# Patient Record
Sex: Female | Born: 1975 | Race: White | Hispanic: No | Marital: Married | Smoking: Never smoker
Health system: Southern US, Community
[De-identification: ages and names within clinical notes are randomized; demographics above are authoritative.]

## PROBLEM LIST (undated history)

## (undated) DIAGNOSIS — O24419 Gestational diabetes mellitus in pregnancy, unspecified control: Secondary | ICD-10-CM

## (undated) DIAGNOSIS — R011 Cardiac murmur, unspecified: Secondary | ICD-10-CM

## (undated) DIAGNOSIS — G4730 Sleep apnea, unspecified: Secondary | ICD-10-CM

## (undated) HISTORY — PX: WISDOM TOOTH EXTRACTION: SHX21

## (undated) HISTORY — PX: MYRINGOTOMY: SUR874

## (undated) HISTORY — PX: HERNIA REPAIR: SHX51

---

## 1998-05-27 ENCOUNTER — Other Ambulatory Visit: Admission: RE | Admit: 1998-05-27 | Discharge: 1998-05-27 | Payer: Self-pay | Admitting: *Deleted

## 1999-04-16 ENCOUNTER — Other Ambulatory Visit: Admission: RE | Admit: 1999-04-16 | Discharge: 1999-04-16 | Payer: Self-pay | Admitting: *Deleted

## 2000-04-12 ENCOUNTER — Other Ambulatory Visit: Admission: RE | Admit: 2000-04-12 | Discharge: 2000-04-12 | Payer: Self-pay | Admitting: *Deleted

## 2001-06-19 ENCOUNTER — Other Ambulatory Visit: Admission: RE | Admit: 2001-06-19 | Discharge: 2001-06-19 | Payer: Self-pay | Admitting: *Deleted

## 2002-01-16 ENCOUNTER — Other Ambulatory Visit: Admission: RE | Admit: 2002-01-16 | Discharge: 2002-01-16 | Payer: Self-pay | Admitting: Obstetrics and Gynecology

## 2002-06-24 ENCOUNTER — Encounter: Admission: RE | Admit: 2002-06-24 | Discharge: 2002-06-24 | Payer: Self-pay | Admitting: Obstetrics and Gynecology

## 2002-08-20 ENCOUNTER — Inpatient Hospital Stay (HOSPITAL_COMMUNITY): Admission: AD | Admit: 2002-08-20 | Discharge: 2002-08-22 | Payer: Self-pay | Admitting: Obstetrics and Gynecology

## 2002-09-24 ENCOUNTER — Other Ambulatory Visit: Admission: RE | Admit: 2002-09-24 | Discharge: 2002-09-24 | Payer: Self-pay | Admitting: Obstetrics and Gynecology

## 2003-10-27 ENCOUNTER — Other Ambulatory Visit: Admission: RE | Admit: 2003-10-27 | Discharge: 2003-10-27 | Payer: Self-pay | Admitting: Obstetrics and Gynecology

## 2004-11-25 ENCOUNTER — Inpatient Hospital Stay (HOSPITAL_COMMUNITY): Admission: AD | Admit: 2004-11-25 | Discharge: 2004-11-27 | Payer: Self-pay | Admitting: Obstetrics and Gynecology

## 2008-04-27 ENCOUNTER — Emergency Department (HOSPITAL_COMMUNITY): Admission: EM | Admit: 2008-04-27 | Discharge: 2008-04-27 | Payer: Self-pay | Admitting: Emergency Medicine

## 2010-11-19 NOTE — H&P (Signed)
NAME:  Kelly Osborne, [name] NO.:  [id number]   MEDICAL RECORD NO.:  [id number]                   PATIENT TYPE:   LOCATION:                                       FACILITY:   PHYSICIAN:  Maxie Better, M.D.            DATE OF BIRTH:  1976/05/03   DATE OF ADMISSION:  08/20/2002  DATE OF DISCHARGE:                                HISTORY & PHYSICAL   CHIEF COMPLAINT:  Induction of labor secondary to gestational diabetes.   HISTORY OF PRESENT ILLNESS:  This is a 35 year old, gravida 1, para 0,  married white female, last menstrual period 11/14/2001, [facility] 08/22/2002, who is  now at term and being admitted for induction of labor secondary to Class A1  gestational diabetes.  The patient has had gestational diabetes well  controlled with diet until the past week at which time she noted that her  blood sugars at dinnertime have been elevated as high as 172.  Ultrasound  done on 08/19/2002 showed estimated fetal weight of 7 pound 7 ounces which  was at the 36th percentile, amniotic fluid index 80th percentile.  She has  had irregular contractions, good fetal movement.  Her NSTs have been  reactive.  Last exam she was 80%, -2, vertex presentation.  Group B strep  culture is negative.  Prenatal care was at [facility], Maxie Better,  M.D., primary obstetrician.   PRENATAL LABORATORY DATA:  Blood type O negative.  The father of the baby  was O positive.  The patient received RhoGAM at 28 weeks.  RPR is  nonreactive, rubella immune, hepatitis B surface antigen negative, HIV  negative, GC and Chlamydia cultures negative.  Pap was within normal limits.  One-hour GTT was 183 on 12/5, three-hour GTT was abnormal.  Normal anatomic  fetal survey 04/10/2002 at 20-4/7 weeks.  Group B strep culture was negative.   ALLERGIES:  SULFA.   MEDICATIONS:  Prenatal vitamins.   PAST MEDICAL HISTORY:  Heart murmur without need for antibiotic prophylaxis.   PAST SURGICAL  HISTORY:  Hernia repair in infancy.   FAMILY HISTORY:  Noncontributory.   SOCIAL HISTORY:  Married, nonsmoker, Airline pilot.   REVIEW OF SYSTEMS:  Negative except as noted in History of Present Illness.   PHYSICAL EXAMINATION:  GENERAL:  Well developed, well nourished, gravid  white female in no acute distress.  VITAL SIGNS:  Blood pressure 104/70, weight 147-1/2 pounds.  Fetal heart  rate 130.  SKIN:  No lesions.  HEENT;  Anicteric sclerae.  Pink conjunctivae.  Oropharynx negative.  HEART:  Regular rate and rhythm without murmur.  LUNGS:  Clear to auscultation.  BREASTS:  Soft, nontender.  No palpable mass.  ABDOMEN:  Gravid.  Fundal height 39 cm.  PELVIC:  See HPI.  EXTREMITIES:  No edema.   IMPRESSION:  1. Class A1 gestational diabetes.  2. Term gestation.   PLAN:  1. Admission  2. Routine admission labs.  3. Pitocin induction.  4. Analgesics p.r.n.  5. Blood sugar checks as per protocol.                                               Maxie Better, M.D.    [place]/MEDQ  D:  08/19/2002  T:  08/19/2002  Job:  [id number]

## 2016-07-18 ENCOUNTER — Other Ambulatory Visit: Payer: Self-pay | Admitting: Obstetrics and Gynecology

## 2016-07-20 ENCOUNTER — Encounter (HOSPITAL_COMMUNITY): Payer: Self-pay

## 2016-07-21 ENCOUNTER — Encounter (HOSPITAL_COMMUNITY)
Admission: RE | Disposition: A | Discharge status: Home / Self Care | Payer: Self-pay | Source: Ambulatory Visit | Attending: Obstetrics and Gynecology

## 2016-07-21 ENCOUNTER — Encounter (HOSPITAL_COMMUNITY): Payer: Self-pay

## 2016-07-21 ENCOUNTER — Ambulatory Visit (HOSPITAL_COMMUNITY): Payer: Managed Care, Other (non HMO) | Admitting: Anesthesiology

## 2016-07-21 ENCOUNTER — Ambulatory Visit (HOSPITAL_COMMUNITY)
Admission: RE | Admit: 2016-07-21 | Discharge: 2016-07-21 | Disposition: A | Discharge status: Home / Self Care | Payer: Managed Care, Other (non HMO) | Source: Ambulatory Visit | Attending: Obstetrics and Gynecology | Admitting: Obstetrics and Gynecology

## 2016-07-21 DIAGNOSIS — N920 Excessive and frequent menstruation with regular cycle: Secondary | ICD-10-CM | POA: Diagnosis present

## 2016-07-21 DIAGNOSIS — R011 Cardiac murmur, unspecified: Secondary | ICD-10-CM | POA: Diagnosis not present

## 2016-07-21 DIAGNOSIS — D250 Submucous leiomyoma of uterus: Secondary | ICD-10-CM | POA: Insufficient documentation

## 2016-07-21 HISTORY — DX: Gestational diabetes mellitus in pregnancy, unspecified control: O24.419

## 2016-07-21 HISTORY — DX: Cardiac murmur, unspecified: R01.1

## 2016-07-21 HISTORY — PX: HYSTEROSCOPY: SHX211

## 2016-07-21 HISTORY — PX: DILATATION & CURETTAGE/HYSTEROSCOPY WITH MYOSURE: SHX6511

## 2016-07-21 LAB — CBC
HCT: 35.7 % — ABNORMAL LOW (ref 36.0–46.0)
HEMOGLOBIN: 12 g/dL (ref 12.0–15.0)
MCH: 30.2 pg (ref 26.0–34.0)
MCHC: 33.6 g/dL (ref 30.0–36.0)
MCV: 89.7 fL (ref 78.0–100.0)
Platelets: 349 10*3/uL (ref 150–400)
RBC: 3.98 MIL/uL (ref 3.87–5.11)
RDW: 13.2 % (ref 11.5–15.5)
WBC: 7.5 10*3/uL (ref 4.0–10.5)

## 2016-07-21 SURGERY — DILATATION & CURETTAGE/HYSTEROSCOPY WITH MYOSURE
Anesthesia: General | Site: Vagina

## 2016-07-21 MED ORDER — FENTANYL CITRATE (PF) 250 MCG/5ML IJ SOLN
INTRAMUSCULAR | Status: DC | PRN
  Administered 2016-07-21: 100 ug via INTRAVENOUS
  Administered 2016-07-21: 50 ug via INTRAVENOUS
  Administered 2016-07-21 (×2): 25 ug via INTRAVENOUS

## 2016-07-21 MED ORDER — FENTANYL CITRATE (PF) 100 MCG/2ML IJ SOLN: 25.0000 ug | INTRAMUSCULAR | Status: DC | PRN

## 2016-07-21 MED ORDER — SODIUM CHLORIDE 0.9 % IR SOLN
Status: DC | PRN
  Administered 2016-07-21 (×2): 3000 mL

## 2016-07-21 MED ORDER — ONDANSETRON HCL 4 MG/2ML IJ SOLN
INTRAMUSCULAR | Status: DC | PRN
  Administered 2016-07-21: 4 mg via INTRAVENOUS

## 2016-07-21 MED ORDER — PROPOFOL 10 MG/ML IV BOLUS
INTRAVENOUS | Status: DC | PRN
  Administered 2016-07-21: 200 mg via INTRAVENOUS

## 2016-07-21 MED ORDER — METOCLOPRAMIDE HCL 5 MG/ML IJ SOLN
INTRAMUSCULAR | Status: DC | PRN
  Administered 2016-07-21: 10 mg via INTRAVENOUS

## 2016-07-21 MED ORDER — MIDAZOLAM HCL 2 MG/2ML IJ SOLN
INTRAMUSCULAR | Status: DC | PRN
  Administered 2016-07-21: 1 mg via INTRAVENOUS

## 2016-07-21 MED ORDER — GLYCOPYRROLATE 0.2 MG/ML IJ SOLN
INTRAMUSCULAR | Status: DC | PRN
  Administered 2016-07-21: 0.1 mg via INTRAVENOUS

## 2016-07-21 MED ORDER — LACTATED RINGERS IV SOLN
INTRAVENOUS | Status: DC
  Administered 2016-07-21: 125 mL/h via INTRAVENOUS
  Administered 2016-07-21: 08:00:00 via INTRAVENOUS

## 2016-07-21 MED ORDER — METOCLOPRAMIDE HCL 5 MG/ML IJ SOLN: 10.0000 mg | Freq: Once | INTRAMUSCULAR | Status: DC | PRN

## 2016-07-21 MED ORDER — LACTATED RINGERS IV SOLN: INTRAVENOUS | Status: DC

## 2016-07-21 MED ORDER — DEXAMETHASONE SODIUM PHOSPHATE 4 MG/ML IJ SOLN
INTRAMUSCULAR | Status: DC | PRN
  Administered 2016-07-21: 4 mg via INTRAVENOUS

## 2016-07-21 MED ORDER — MIDAZOLAM HCL 2 MG/2ML IJ SOLN
INTRAMUSCULAR | Status: AC
  Filled 2016-07-21: qty 2

## 2016-07-21 MED ORDER — SCOPOLAMINE 1 MG/3DAYS TD PT72
MEDICATED_PATCH | TRANSDERMAL | Status: DC
Start: 2016-07-21 — End: 2016-07-21
  Administered 2016-07-21: 1.5 mg via TRANSDERMAL
  Filled 2016-07-21: qty 1

## 2016-07-21 MED ORDER — METOCLOPRAMIDE HCL 5 MG/ML IJ SOLN
INTRAMUSCULAR | Status: AC
  Filled 2016-07-21: qty 2

## 2016-07-21 MED ORDER — CHLOROPROCAINE HCL 1 % IJ SOLN
INTRAMUSCULAR | Status: AC
  Filled 2016-07-21: qty 30

## 2016-07-21 MED ORDER — LIDOCAINE HCL (CARDIAC) 20 MG/ML IV SOLN
INTRAVENOUS | Status: AC
  Filled 2016-07-21: qty 5

## 2016-07-21 MED ORDER — FENTANYL CITRATE (PF) 100 MCG/2ML IJ SOLN
INTRAMUSCULAR | Status: AC
  Filled 2016-07-21: qty 4

## 2016-07-21 MED ORDER — KETOROLAC TROMETHAMINE 30 MG/ML IJ SOLN
INTRAMUSCULAR | Status: DC | PRN
  Administered 2016-07-21: 30 mg via INTRAVENOUS
  Administered 2016-07-21: 30 mg via INTRAMUSCULAR

## 2016-07-21 MED ORDER — SCOPOLAMINE 1 MG/3DAYS TD PT72
1.0000 | MEDICATED_PATCH | Freq: Once | TRANSDERMAL | Status: DC
  Administered 2016-07-21: 1.5 mg via TRANSDERMAL

## 2016-07-21 MED ORDER — IBUPROFEN 800 MG PO TABS: 800.0000 mg | ORAL_TABLET | Freq: Two times a day (BID) | ORAL | 1 refills | Status: DC | PRN

## 2016-07-21 MED ORDER — LIDOCAINE HCL (CARDIAC) 20 MG/ML IV SOLN
INTRAVENOUS | Status: DC | PRN
  Administered 2016-07-21: 100 mg via INTRAVENOUS

## 2016-07-21 MED ORDER — ONDANSETRON HCL 4 MG/2ML IJ SOLN
INTRAMUSCULAR | Status: AC
  Filled 2016-07-21: qty 2

## 2016-07-21 MED ORDER — DEXAMETHASONE SODIUM PHOSPHATE 10 MG/ML IJ SOLN
INTRAMUSCULAR | Status: AC
  Filled 2016-07-21: qty 1

## 2016-07-21 MED ORDER — OXYCODONE-ACETAMINOPHEN 5-325 MG PO TABS: 1.0000 | ORAL_TABLET | ORAL | 0 refills | Status: DC | PRN

## 2016-07-21 MED ORDER — MEPERIDINE HCL 25 MG/ML IJ SOLN: 6.2500 mg | INTRAMUSCULAR | Status: DC | PRN

## 2016-07-21 MED ORDER — PROPOFOL 10 MG/ML IV BOLUS
INTRAVENOUS | Status: AC
  Filled 2016-07-21: qty 20

## 2016-07-21 MED ORDER — CHLOROPROCAINE HCL 1 % IJ SOLN
INTRAMUSCULAR | Status: DC | PRN
  Administered 2016-07-21: 20 mL

## 2016-07-21 SURGICAL SUPPLY — 22 items
ABLATOR ENDOMETRIAL BIPOLAR (ABLATOR) ×3 IMPLANT
CANISTER SUCT 3000ML (MISCELLANEOUS) ×3 IMPLANT
CATH ROBINSON RED A/P 16FR (CATHETERS) ×3 IMPLANT
CLOTH BEACON ORANGE TIMEOUT ST (SAFETY) ×3 IMPLANT
CONTAINER PREFILL 10% NBF 60ML (FORM) ×3 IMPLANT
DEVICE MYOSURE LITE (MISCELLANEOUS) IMPLANT
DEVICE MYOSURE REACH (MISCELLANEOUS) ×3 IMPLANT
ELECT REM PT RETURN 9FT ADLT (ELECTROSURGICAL)
ELECTRODE REM PT RTRN 9FT ADLT (ELECTROSURGICAL) IMPLANT
FILTER ARTHROSCOPY CONVERTOR (FILTER) ×3 IMPLANT
GLOVE BIOGEL PI IND STRL 7.0 (GLOVE) ×2 IMPLANT
GLOVE BIOGEL PI INDICATOR 7.0 (GLOVE) ×4
GLOVE ECLIPSE 6.5 STRL STRAW (GLOVE) ×3 IMPLANT
GOWN STRL REUS W/TWL LRG LVL3 (GOWN DISPOSABLE) ×9 IMPLANT
PACK VAGINAL MINOR WOMEN LF (CUSTOM PROCEDURE TRAY) ×3 IMPLANT
PAD OB MATERNITY 4.3X12.25 (PERSONAL CARE ITEMS) ×6 IMPLANT
SEAL ROD LENS SCOPE MYOSURE (ABLATOR) ×3 IMPLANT
SET GENESYS HTA PROCERVA (MISCELLANEOUS) ×3 IMPLANT
TOWEL OR 17X24 6PK STRL BLUE (TOWEL DISPOSABLE) ×6 IMPLANT
TUBING AQUILEX INFLOW (TUBING) ×3 IMPLANT
TUBING AQUILEX OUTFLOW (TUBING) ×3 IMPLANT
WATER STERILE IRR 1000ML POUR (IV SOLUTION) ×3 IMPLANT

## 2016-07-21 NOTE — Brief Op Note (Signed)
07/21/2016  8:28 AM  PATIENT:  Kelly Osborne  41 y.o. female  PRE-OPERATIVE DIAGNOSIS:  Submucosal Fibroid. menorrhagia  POST-OPERATIVE DIAGNOSIS:  Submucosal Fibroid, Menorrhagia  PROCEDURE:  Diagnostic hysteroscopy, Dilation and curettage, hysteroscopic resection of submucosal fibroid  SURGEON:  Surgeon(s) and Role:    * Servando Salina, MD - Primary  PHYSICIAN ASSISTANT:   ASSISTANTS: none   ANESTHESIA:   general and paracervical block FINDINGS; posterior central SM fibroid, left >right tubal ostia seen EBL:  Total I/O In: 1300 [I.V.:1300] Out: 15 [Blood:15]  BLOOD ADMINISTERED:none  DRAINS: none   LOCAL MEDICATIONS USED:  OTHER nesicaine  SPECIMEN:  Source of Specimen:  SM fibroid resection, emc  DISPOSITION OF SPECIMEN:  PATHOLOGY  COUNTS:  YES  TOURNIQUET:  * No tourniquets in log *  DICTATION: .Other Dictation: Dictation Number [id number]  PLAN OF CARE: Discharge to home after PACU  PATIENT DISPOSITION:  PACU - hemodynamically stable.   Delay start of Pharmacological VTE agent (>24hrs) due to surgical blood loss or risk of bleeding: no

## 2016-07-21 NOTE — Transfer of Care (Signed)
Immediate Anesthesia Transfer of Care Note  Patient: Kelly Osborne  Procedure(s) Performed: Procedure(s): DILATATION & CURETTAGE/HYSTEROSCOPY WITH MYOSURE (N/A) HYSTEROSCOPY WITH HYDROTHERMAL ABLATION (N/A)  Patient Location: PACU  Anesthesia Type:General  Level of Consciousness: awake, alert  and oriented  Airway & Oxygen Therapy: Patient Spontanous Breathing and Patient connected to nasal cannula oxygen  Post-op Assessment: Report given to RN and Post -op Vital signs reviewed and stable  Post vital signs: Reviewed and stable  Last Vitals:  Vitals:   07/21/16 0610  BP: 111/60  Pulse: 81  Resp: 16  Temp: 37 C    Last Pain:  Vitals:   07/21/16 0610  TempSrc: Oral      Patients Stated Pain Goal: 4 ([id number])  Complications: No apparent anesthesia complications

## 2016-07-21 NOTE — Op Note (Signed)
NAMEAM…LIE, DEAN                 ACCOUNT NO.:  [id number]  MEDICAL RECORD NO.:  [id number]  LOCATION:  [facility]                          FACILITY:  [facility]  PHYSICIAN:  Servando Salina, M.D.DATE OF BIRTH:  1975/07/16  DATE OF PROCEDURE:  07/21/2016 DATE OF DISCHARGE:                              OPERATIVE REPORT   PREOPERATIVE DIAGNOSIS:  Menorrhagia, submucosal fibroid.  PROCEDURE:  Diagnostic hysteroscopy, hysteroscopic resection of submucosal fibroid using MyoSure, NovaSure endometrial ablation.  POSTOPERATIVE DIAGNOSIS:  Menorrhagia, submucosal fibroid.  ANESTHESIA:  General, paracervical block.  SURGEON:  Servando Salina, M.D..  ASSISTANT:  None.  DESCRIPTION OF PROCEDURE:  Under general anesthesia, the patient was placed in dorsal lithotomy position.  She was sterilely prepped and draped in usual fashion.  The patient had voided prior to entering the room and therefore it was not catheterized.  Examination under anesthesia revealed a retroverted uterus.  No adnexal masses could be appreciated.  Bivalve speculum was placed in vagina.  Single-tooth tenaculum was placed on anterior lip of the cervix.  A 20 mL of 1% Nesacaine was injected paracervically at the 3 and 9 o'clock position. The cervix was then serially dilated up to #23 [facility] dilator.  A hysteroscope was introduced into the uterine cavity.  The posterior submucosal fibroid was noted centrally.  The left greater than the right tubal ostia was seen.  Using the Reach MyoSure, the fibroid was resected.  Subsequently the cavity was gently curetted.  The instrument was then removed.  The uterus sounded to 10 cm.  The endocervical canal was 3 cm and using a NovaSure endometrial apparatus which was then inserted into the cavity with a 4.3 was noted and the cavity length 6.5 cm was then ablated for 35 seconds.  The instrument was removed.  The hysteroscope was then reinserted.  The majority of the cavity had been ablated.   At that point, all instruments were then removed from the vagina.  SPECIMEN:  Endometrial curetting with fibroid resection sent to Pathology.  ESTIMATED BLOOD LOSS:  15 mL.  COMPLICATION:  None.  FLUID DEFICIT:  1200 mL.  The patient tolerated the procedure well, was transferred to recovery room in stable condition.     Servando Salina, M.D.     [place]/MEDQ  D:  07/21/2016  T:  07/21/2016  Job:  [id number]

## 2016-07-21 NOTE — Discharge Instructions (Signed)
DISCHARGE INSTRUCTIONS: HYSTEROSCOPY / ENDOMETRIAL ABLATION °The following instructions have been prepared to help you care for yourself upon your return home. ° °May Remove Scop patch on or before ° °May take Ibuprofen after ° °May take stool softner while taking narcotic pain medication to prevent constipation.  Drink plenty of water. ° °Personal hygiene: °• Use sanitary pads for vaginal drainage, not tampons. °• Shower the day after your procedure. °• NO tub baths, pools or Jacuzzis for 2-3 weeks. °• Wipe front to back after using the bathroom. ° °Activity and limitations: °• Do NOT drive or operate any equipment for 24 hours. The effects of anesthesia are still present °and drowsiness may result. °• Do NOT rest in bed all day. °• Walking is encouraged. °• Walk up and down stairs slowly. °• You may resume your normal activity in one to two days or as indicated by your physician. °Sexual activity: NO intercourse for at least 2 weeks after the procedure, or as indicated by your °Doctor. ° °Diet: Eat a light meal as desired this evening. You may resume your usual diet tomorrow. ° °Return to Work: You may resume your work activities in one to two days or as indicated by your °Doctor. ° °What to expect after your surgery: Expect to have vaginal bleeding/discharge for 2-3 days and °spotting for up to 10 days. It is not unusual to have soreness for up to 1-2 weeks. You may have a °slight burning sensation when you urinate for the first day. Mild cramps may continue for a couple of °days. You may have a regular period in 2-6 weeks. ° °Call your doctor for any of the following: °• Excessive vaginal bleeding or clotting, saturating and changing one pad every hour. °• Inability to urinate 6 hours after discharge from hospital. °• Pain not relieved by pain medication. °• Fever of 100.4° F or greater. °• Unusual vaginal discharge or odor. ° °Return to office _________________Call for an appointment  ___________________ °Patient’s signature: ______________________ °Nurse’s signature ________________________ ° °Post Anesthesia Care Unit [phone] °Post Anesthesia Home Care Instructions ° °Activity: °Get plenty of rest for the remainder of the day. A responsible adult should stay with you for 24 hours following the procedure.  °For the next 24 hours, DO NOT: °-Drive a car °-Operate machinery °-Drink alcoholic beverages °-Take any medication unless instructed by your physician °-Make any legal decisions or sign important papers. ° °Meals: °Start with liquid foods such as gelatin or soup. Progress to regular foods as tolerated. Avoid greasy, spicy, heavy foods. If nausea and/or vomiting occur, drink only clear liquids until the nausea and/or vomiting subsides. Call your physician if vomiting continues. ° °Special Instructions/Symptoms: °Your throat may feel dry or sore from the anesthesia or the breathing tube placed in your throat during surgery. If this causes discomfort, gargle with warm salt water. The discomfort should disappear within 24 hours. ° °If you had a scopolamine patch placed behind your ear for the management of post- operative nausea and/or vomiting: ° °1. The medication in the patch is effective for 72 hours, after which it should be removed.  Wrap patch in a tissue and discard in the trash. Wash hands thoroughly with soap and water. °2. You may remove the patch earlier than 72 hours if you experience unpleasant side effects which may include dry mouth, dizziness or visual disturbances. °3. Avoid touching the patch. Wash your hands with soap and water after contact with the patch. °  °CALL  IF TEMP>100.4, NOTHING PER   VAGINA X 2 WK, CALL IF SOAKING A MAXI  PAD EVERY HOUR OR MORE FREQUENTLY °

## 2016-07-21 NOTE — Anesthesia Postprocedure Evaluation (Signed)
Anesthesia Post Note  Patient: TEATHER JASSO  Procedure(s) Performed: Procedure(s) (LRB): DILATATION & CURETTAGE/HYSTEROSCOPY WITH MYOSURE (N/A) HYSTEROSCOPY WITH HYDROTHERMAL ABLATION (N/A)  Patient location during evaluation: PACU Anesthesia Type: General Level of consciousness: awake and alert Pain management: pain level controlled Vital Signs Assessment: post-procedure vital signs reviewed and stable Respiratory status: spontaneous breathing, nonlabored ventilation, respiratory function stable and patient connected to nasal cannula oxygen Cardiovascular status: blood pressure returned to baseline and stable Postop Assessment: no signs of nausea or vomiting Anesthetic complications: no        Last Vitals:  Vitals:   07/21/16 0915 07/21/16 1002  BP: 110/60 125/73  Pulse: 96 85  Resp: 19 16  Temp: 37 C 36.7 C    Last Pain:  Vitals:   07/21/16 1002  TempSrc:   PainSc: 2    Pain Goal: Patients Stated Pain Goal: 4 (07/21/16 1002)               Montez Hageman

## 2016-07-21 NOTE — Anesthesia Procedure Notes (Signed)
Procedure Name: LMA Insertion Date/Time: 07/21/2016 7:35 AM Performed by: Flossie Dibble Pre-anesthesia Checklist: Patient identified, Patient being monitored, Emergency Drugs available, Timeout performed and Suction available Patient Re-evaluated:Patient Re-evaluated prior to inductionOxygen Delivery Method: Circle System Utilized Preoxygenation: Pre-oxygenation with 100% oxygen Intubation Type: IV induction LMA: LMA inserted LMA Size: 4.0 Number of attempts: 1 Placement Confirmation: positive ETCO2 and breath sounds checked- equal and bilateral Tube secured with: Tape Dental Injury: Teeth and Oropharynx as per pre-operative assessment

## 2016-07-21 NOTE — H&P (Signed)
Kelly Osborne is an 41 y.o. female. G2P2 MWF presents for dx hysteroscopy, resection of SM fibroid, D&C, endometrial ablation due to menorrhagia and fundal fibroid noted on sonohysterogram. Pt has had heavy vaginal bleeding despite OCP Endometrial biopsy done 1/16 is still pending Pertinent Gynecological History: Menses: flow is excessive with use of 6 pads or tampons on heaviest days Bleeding: dysfunctional uterine bleeding Contraception: OCP (estrogen/progesterone) DES exposure: denies Blood transfusions: none Sexually transmitted diseases: no past history Previous GYN Procedures: none  Last mammogram: n/a Date: n/a Last pap: normal Date: 2017 OB History: G2, P2   Menstrual History: Menarche age: n/a Patient's last menstrual period was 07/09/2016 (exact date).    Past Medical History:  Diagnosis Date  . Gestational diabetes   . Heart murmur    mild, noted with pregnancy    Past Surgical History:  Procedure Laterality Date  . HERNIA REPAIR     childhood  . MYRINGOTOMY    . WISDOM TOOTH EXTRACTION      History reviewed. No pertinent family history.  Social History:  reports that she has never smoked. She has never used smokeless tobacco. She reports that she drinks alcohol. She reports that she does not use drugs.  Allergies:  Allergies  Allergen Reactions  . Sulfa Antibiotics Rash    No prescriptions prior to admission.    Review of Systems  All other systems reviewed and are negative.   Last menstrual period 07/09/2016. Physical Exam  Constitutional: She is oriented to person, place, and time. She appears well-developed and well-nourished.  HENT:  Head: Atraumatic.  Eyes: EOM are normal. Pupils are equal, round, and reactive to light.  Neck: Neck supple.  Cardiovascular: Regular rhythm.   Respiratory: Breath sounds normal.  GI: Soft. Bowel sounds are normal.  Genitourinary: Vagina normal.  Genitourinary Comments: Sl enlarged uterus Adnexa nl cervix  parous  Musculoskeletal: Normal range of motion.  Neurological: She is alert and oriented to person, place, and time.  Skin: Skin is warm and dry.  Psychiatric: She has a normal mood and affect.    No results found for this or any previous visit (from the past 24 hour(s)).  No results found.  Assessment/Plan: SM fibroid Menorrhagia with regular cycle P) dx hysteroscopy, D&C, resection of  SM fibroid. Risk of surgery includes infection, bleeding, thermal injury, uterine perforation and its risk, inability to resection fibroid, fluid overload and its mgmt. All ? answered  Neilah Fulwider A 07/21/2016, 12:34 AM

## 2016-07-21 NOTE — Anesthesia Preprocedure Evaluation (Signed)
Anesthesia Evaluation  Patient identified by MRN, date of birth, ID band Patient awake    Reviewed: Allergy & Precautions, NPO status , Patient's Chart, lab work & pertinent test results  Airway Mallampati: II  TM Distance: >3 FB Neck ROM: Full    Dental no notable dental hx.    Pulmonary neg pulmonary ROS,    Pulmonary exam normal breath sounds clear to auscultation       Cardiovascular negative cardio ROS Normal cardiovascular exam Rhythm:Regular Rate:Normal     Neuro/Psych negative neurological ROS  negative psych ROS   GI/Hepatic negative GI ROS, Neg liver ROS,   Endo/Other  negative endocrine ROSneg diabetes  Renal/GU negative Renal ROS  negative genitourinary   Musculoskeletal negative musculoskeletal ROS (+)   Abdominal   Peds negative pediatric ROS (+)  Hematology negative hematology ROS (+)   Anesthesia Other Findings   Reproductive/Obstetrics negative OB ROS                             Anesthesia Physical Anesthesia Plan  ASA: II  Anesthesia Plan: General   Post-op Pain Management:    Induction: Intravenous  Airway Management Planned: LMA  Additional Equipment:   Intra-op Plan:   Post-operative Plan: Extubation in OR  Informed Consent: I have reviewed the patients History and Physical, chart, labs and discussed the procedure including the risks, benefits and alternatives for the proposed anesthesia with the patient or authorized representative who has indicated his/her understanding and acceptance.   Dental advisory given  Plan Discussed with: CRNA  Anesthesia Plan Comments:         Anesthesia Quick Evaluation

## 2016-07-22 ENCOUNTER — Encounter (HOSPITAL_COMMUNITY): Payer: Self-pay | Admitting: Obstetrics and Gynecology

## 2017-01-05 ENCOUNTER — Other Ambulatory Visit: Payer: Self-pay | Admitting: Obstetrics and Gynecology

## 2017-01-06 NOTE — Patient Instructions (Signed)
Your procedure is scheduled on:  Thursday, January 12, 2017  Enter through the [employer] [facility] at:  10:45 AM  Pick up the phone at the desk and dial [phone].  Call this number if you have problems the morning of surgery: [phone].  Remember: Do NOT eat food:  After Midnight Wednesday  Do NOT drink clear liquids after:  6:00 AM Thursday  Take these medicines the morning of surgery with a SIP OF WATER:  None  Stop ALL herbal medications at this time  Do NOT smoke the day of surgery.  Do NOT wear jewelry (body piercing), metal hair clips/bobby pins, make-up, artifical eyelashes or nail polish. Do NOT wear lotions, powders, or perfumes.  You may wear deodorant. Do NOT shave for 48 hours prior to surgery. Do NOT bring valuables to the hospital. Contacts, dentures, or bridgework may not be worn into surgery.  Leave suitcase in car.  After surgery it may be brought to your room.  For patients admitted to the hospital, checkout time is 11:00 AM the day of discharge.  Bring a copy of your healthcare power of attorney and living will documents.

## 2017-01-09 ENCOUNTER — Encounter (HOSPITAL_COMMUNITY)
Admission: RE | Admit: 2017-01-09 | Discharge: 2017-01-09 | Disposition: A | Discharge status: Home / Self Care | Payer: Managed Care, Other (non HMO) | Source: Ambulatory Visit | Attending: Obstetrics and Gynecology | Admitting: Obstetrics and Gynecology

## 2017-01-09 ENCOUNTER — Encounter (HOSPITAL_COMMUNITY): Payer: Self-pay

## 2017-01-09 DIAGNOSIS — N938 Other specified abnormal uterine and vaginal bleeding: Secondary | ICD-10-CM | POA: Insufficient documentation

## 2017-01-09 DIAGNOSIS — D259 Leiomyoma of uterus, unspecified: Secondary | ICD-10-CM | POA: Insufficient documentation

## 2017-01-09 DIAGNOSIS — Z01812 Encounter for preprocedural laboratory examination: Secondary | ICD-10-CM | POA: Diagnosis present

## 2017-01-09 LAB — CBC
HEMATOCRIT: 35.4 % — AB (ref 36.0–46.0)
HEMOGLOBIN: 11.4 g/dL — AB (ref 12.0–15.0)
MCH: 28.1 pg (ref 26.0–34.0)
MCHC: 32.2 g/dL (ref 30.0–36.0)
MCV: 87.4 fL (ref 78.0–100.0)
Platelets: 380 10*3/uL (ref 150–400)
RBC: 4.05 MIL/uL (ref 3.87–5.11)
RDW: 13.5 % (ref 11.5–15.5)
WBC: 9.9 10*3/uL (ref 4.0–10.5)

## 2017-01-09 LAB — BASIC METABOLIC PANEL
ANION GAP: 6 (ref 5–15)
BUN: 8 mg/dL (ref 6–20)
CO2: 26 mmol/L (ref 22–32)
Calcium: 8.8 mg/dL — ABNORMAL LOW (ref 8.9–10.3)
Chloride: 105 mmol/L (ref 101–111)
Creatinine, Ser: 0.88 mg/dL (ref 0.44–1.00)
GFR calc Af Amer: >60 mL/min (ref >60)
GLUCOSE: 169 mg/dL — AB (ref 65–99)
POTASSIUM: 3.6 mmol/L (ref 3.5–5.1)
SODIUM: 137 mmol/L (ref 135–145)

## 2017-01-12 ENCOUNTER — Encounter (HOSPITAL_COMMUNITY): Payer: Self-pay | Admitting: Anesthesiology

## 2017-01-12 ENCOUNTER — Encounter (HOSPITAL_COMMUNITY)
Admission: RE | Disposition: A | Discharge status: Home / Self Care | Payer: Self-pay | Source: Ambulatory Visit | Attending: Obstetrics and Gynecology

## 2017-01-12 ENCOUNTER — Ambulatory Visit (HOSPITAL_COMMUNITY): Payer: Managed Care, Other (non HMO) | Admitting: Anesthesiology

## 2017-01-12 ENCOUNTER — Ambulatory Visit (HOSPITAL_COMMUNITY)
Admission: RE | Admit: 2017-01-12 | Discharge: 2017-01-13 | Disposition: A | Discharge status: Home / Self Care | Payer: Managed Care, Other (non HMO) | Source: Ambulatory Visit | Attending: Obstetrics and Gynecology | Admitting: Obstetrics and Gynecology

## 2017-01-12 DIAGNOSIS — D259 Leiomyoma of uterus, unspecified: Secondary | ICD-10-CM | POA: Diagnosis not present

## 2017-01-12 DIAGNOSIS — Z90710 Acquired absence of both cervix and uterus: Secondary | ICD-10-CM | POA: Diagnosis present

## 2017-01-12 DIAGNOSIS — N938 Other specified abnormal uterine and vaginal bleeding: Secondary | ICD-10-CM | POA: Insufficient documentation

## 2017-01-12 HISTORY — PX: LAPAROSCOPIC VAGINAL HYSTERECTOMY WITH SALPINGECTOMY: SHX6680

## 2017-01-12 SURGERY — HYSTERECTOMY, VAGINAL, LAPAROSCOPY-ASSISTED, WITH SALPINGECTOMY
Anesthesia: General | Site: Abdomen | Laterality: Bilateral

## 2017-01-12 MED ORDER — LACTATED RINGERS IV SOLN
INTRAVENOUS | Status: DC
  Administered 2017-01-12: 125 mL/h via INTRAVENOUS
  Administered 2017-01-12: 14:00:00 via INTRAVENOUS

## 2017-01-12 MED ORDER — HYDROMORPHONE HCL 1 MG/ML IJ SOLN
0.2500 mg | INTRAMUSCULAR | Status: DC | PRN
  Administered 2017-01-12: 0.5 mg via INTRAVENOUS

## 2017-01-12 MED ORDER — BUPIVACAINE HCL (PF) 0.25 % IJ SOLN
INTRAMUSCULAR | Status: AC
  Filled 2017-01-12: qty 30

## 2017-01-12 MED ORDER — KETOROLAC TROMETHAMINE 30 MG/ML IJ SOLN: 30.0000 mg | Freq: Once | INTRAMUSCULAR | Status: DC | PRN

## 2017-01-12 MED ORDER — HYDROMORPHONE HCL 1 MG/ML IJ SOLN
INTRAMUSCULAR | Status: AC
  Filled 2017-01-12: qty 0.5

## 2017-01-12 MED ORDER — LACTATED RINGERS IR SOLN
Status: DC | PRN
  Administered 2017-01-12: 3000 mL

## 2017-01-12 MED ORDER — ESTRADIOL 0.1 MG/GM VA CREA
TOPICAL_CREAM | VAGINAL | Status: AC
  Filled 2017-01-12: qty 42.5

## 2017-01-12 MED ORDER — EPHEDRINE SULFATE 50 MG/ML IJ SOLN
INTRAMUSCULAR | Status: DC | PRN
  Administered 2017-01-12: 10 mg via INTRAVENOUS

## 2017-01-12 MED ORDER — DEXTROSE IN LACTATED RINGERS 5 % IV SOLN
INTRAVENOUS | Status: DC
  Administered 2017-01-12: 17:00:00 via INTRAVENOUS

## 2017-01-12 MED ORDER — PROPOFOL 10 MG/ML IV BOLUS
INTRAVENOUS | Status: DC | PRN
  Administered 2017-01-12: 160 mg via INTRAVENOUS

## 2017-01-12 MED ORDER — PROMETHAZINE HCL 25 MG/ML IJ SOLN: 6.2500 mg | INTRAMUSCULAR | Status: DC | PRN

## 2017-01-12 MED ORDER — FENTANYL CITRATE (PF) 250 MCG/5ML IJ SOLN
INTRAMUSCULAR | Status: AC
  Filled 2017-01-12: qty 5

## 2017-01-12 MED ORDER — ONDANSETRON HCL 4 MG/2ML IJ SOLN: 4.0000 mg | Freq: Four times a day (QID) | INTRAMUSCULAR | Status: DC | PRN

## 2017-01-12 MED ORDER — ESTRADIOL 0.1 MG/GM VA CREA
TOPICAL_CREAM | VAGINAL | Status: DC | PRN
  Administered 2017-01-12: 1 via VAGINAL

## 2017-01-12 MED ORDER — ROCURONIUM BROMIDE 100 MG/10ML IV SOLN
INTRAVENOUS | Status: DC | PRN
  Administered 2017-01-12: 40 mg via INTRAVENOUS
  Administered 2017-01-12: 20 mg via INTRAVENOUS

## 2017-01-12 MED ORDER — MIDAZOLAM HCL 2 MG/2ML IJ SOLN
INTRAMUSCULAR | Status: AC
  Filled 2017-01-12: qty 2

## 2017-01-12 MED ORDER — KETOROLAC TROMETHAMINE 30 MG/ML IJ SOLN
30.0000 mg | Freq: Four times a day (QID) | INTRAMUSCULAR | Status: DC
  Administered 2017-01-12 – 2017-01-13 (×3): 30 mg via INTRAVENOUS
  Filled 2017-01-12 (×3): qty 1

## 2017-01-12 MED ORDER — LIDOCAINE-EPINEPHRINE (PF) 1 %-1:200000 IJ SOLN
INTRAMUSCULAR | Status: DC | PRN
  Administered 2017-01-12: 25 mL

## 2017-01-12 MED ORDER — DEXAMETHASONE SODIUM PHOSPHATE 10 MG/ML IJ SOLN
INTRAMUSCULAR | Status: DC | PRN
  Administered 2017-01-12: 4 mg via INTRAVENOUS

## 2017-01-12 MED ORDER — SCOPOLAMINE 1 MG/3DAYS TD PT72
1.0000 | MEDICATED_PATCH | Freq: Once | TRANSDERMAL | Status: DC
  Administered 2017-01-12: 1.5 mg via TRANSDERMAL

## 2017-01-12 MED ORDER — SIMETHICONE 80 MG PO CHEW: 80.0000 mg | CHEWABLE_TABLET | Freq: Four times a day (QID) | ORAL | Status: DC | PRN

## 2017-01-12 MED ORDER — PROPOFOL 10 MG/ML IV BOLUS
INTRAVENOUS | Status: AC
  Filled 2017-01-12: qty 20

## 2017-01-12 MED ORDER — MENTHOL 3 MG MT LOZG: 1.0000 | LOZENGE | OROMUCOSAL | Status: DC | PRN

## 2017-01-12 MED ORDER — FENTANYL CITRATE (PF) 100 MCG/2ML IJ SOLN
INTRAMUSCULAR | Status: DC | PRN
  Administered 2017-01-12: 100 ug via INTRAVENOUS
  Administered 2017-01-12 (×2): 50 ug via INTRAVENOUS

## 2017-01-12 MED ORDER — LIDOCAINE HCL (CARDIAC) 20 MG/ML IV SOLN
INTRAVENOUS | Status: DC | PRN
  Administered 2017-01-12: 60 mg via INTRAVENOUS

## 2017-01-12 MED ORDER — ONDANSETRON HCL 4 MG/2ML IJ SOLN
INTRAMUSCULAR | Status: AC
Start: 2017-01-12 — End: 2017-01-12
  Filled 2017-01-12: qty 2

## 2017-01-12 MED ORDER — KETOROLAC TROMETHAMINE 30 MG/ML IJ SOLN
INTRAMUSCULAR | Status: DC | PRN
  Administered 2017-01-12: 30 mg via INTRAVENOUS

## 2017-01-12 MED ORDER — DEXAMETHASONE SODIUM PHOSPHATE 4 MG/ML IJ SOLN
INTRAMUSCULAR | Status: AC
  Filled 2017-01-12: qty 1

## 2017-01-12 MED ORDER — SCOPOLAMINE 1 MG/3DAYS TD PT72
MEDICATED_PATCH | TRANSDERMAL | Status: AC
  Administered 2017-01-12: 1.5 mg via TRANSDERMAL
  Filled 2017-01-12: qty 1

## 2017-01-12 MED ORDER — CEFAZOLIN SODIUM-DEXTROSE 2-4 GM/100ML-% IV SOLN
2.0000 g | INTRAVENOUS | Status: AC
  Administered 2017-01-12: 2 g via INTRAVENOUS

## 2017-01-12 MED ORDER — MEPERIDINE HCL 25 MG/ML IJ SOLN: 6.2500 mg | INTRAMUSCULAR | Status: DC | PRN

## 2017-01-12 MED ORDER — ONDANSETRON HCL 4 MG/2ML IJ SOLN
INTRAMUSCULAR | Status: DC | PRN
  Administered 2017-01-12: 4 mg via INTRAVENOUS

## 2017-01-12 MED ORDER — HYDROMORPHONE HCL 1 MG/ML IJ SOLN: 0.2000 mg | INTRAMUSCULAR | Status: DC | PRN

## 2017-01-12 MED ORDER — OXYCODONE-ACETAMINOPHEN 5-325 MG PO TABS: 1.0000 | ORAL_TABLET | ORAL | Status: DC | PRN

## 2017-01-12 MED ORDER — KETOROLAC TROMETHAMINE 30 MG/ML IJ SOLN: 30.0000 mg | Freq: Four times a day (QID) | INTRAMUSCULAR | Status: DC

## 2017-01-12 MED ORDER — IBUPROFEN 800 MG PO TABS
800.0000 mg | ORAL_TABLET | Freq: Three times a day (TID) | ORAL | Status: DC | PRN
  Administered 2017-01-12: 800 mg via ORAL
  Filled 2017-01-12: qty 1

## 2017-01-12 MED ORDER — LIDOCAINE-EPINEPHRINE (PF) 1 %-1:200000 IJ SOLN
INTRAMUSCULAR | Status: AC
  Filled 2017-01-12: qty 30

## 2017-01-12 MED ORDER — SUGAMMADEX SODIUM 200 MG/2ML IV SOLN
INTRAVENOUS | Status: DC | PRN
  Administered 2017-01-12: 154.4 mg via INTRAVENOUS

## 2017-01-12 MED ORDER — SODIUM CHLORIDE 0.9 % IJ SOLN
INTRAMUSCULAR | Status: AC
  Filled 2017-01-12: qty 10

## 2017-01-12 MED ORDER — BUPIVACAINE HCL (PF) 0.25 % IJ SOLN
INTRAMUSCULAR | Status: DC | PRN
  Administered 2017-01-12: 7 mL

## 2017-01-12 MED ORDER — ONDANSETRON HCL 4 MG PO TABS: 4.0000 mg | ORAL_TABLET | Freq: Four times a day (QID) | ORAL | Status: DC | PRN

## 2017-01-12 MED ORDER — LIDOCAINE HCL (CARDIAC) 20 MG/ML IV SOLN
INTRAVENOUS | Status: AC
  Filled 2017-01-12: qty 5

## 2017-01-12 MED ORDER — ROCURONIUM BROMIDE 100 MG/10ML IV SOLN
INTRAVENOUS | Status: AC
  Filled 2017-01-12: qty 1

## 2017-01-12 MED ORDER — PANTOPRAZOLE SODIUM 40 MG PO TBEC
40.0000 mg | DELAYED_RELEASE_TABLET | Freq: Every day | ORAL | Status: DC
  Administered 2017-01-13: 40 mg via ORAL
  Filled 2017-01-12: qty 1

## 2017-01-12 MED ORDER — MIDAZOLAM HCL 2 MG/2ML IJ SOLN
INTRAMUSCULAR | Status: DC | PRN
  Administered 2017-01-12: 2 mg via INTRAVENOUS

## 2017-01-12 SURGICAL SUPPLY — 54 items
APPLICATOR ARISTA FLEXITIP XL (MISCELLANEOUS) ×3 IMPLANT
APPLICATOR COTTON TIP 6IN STRL (MISCELLANEOUS) IMPLANT
CABLE HIGH FREQUENCY MONO STRZ (ELECTRODE) ×3 IMPLANT
CATH ROBINSON RED A/P 16FR (CATHETERS) IMPLANT
CLOTH BEACON ORANGE TIMEOUT ST (SAFETY) ×3 IMPLANT
CONT PATH 16OZ SNAP LID 3702 (MISCELLANEOUS) ×3 IMPLANT
COVER BACK TABLE 60X90IN (DRAPES) ×3 IMPLANT
COVER LIGHT HANDLE  1/PK (MISCELLANEOUS) ×2
COVER LIGHT HANDLE 1/PK (MISCELLANEOUS) ×1 IMPLANT
DECANTER SPIKE VIAL GLASS SM (MISCELLANEOUS) ×9 IMPLANT
DERMABOND ADVANCED (GAUZE/BANDAGES/DRESSINGS) ×2
DERMABOND ADVANCED .7 DNX12 (GAUZE/BANDAGES/DRESSINGS) ×1 IMPLANT
DRSG OPSITE POSTOP 3X4 (GAUZE/BANDAGES/DRESSINGS) ×3 IMPLANT
DURAPREP 26ML APPLICATOR (WOUND CARE) ×3 IMPLANT
ELECT REM PT RETURN 9FT ADLT (ELECTROSURGICAL) ×3
ELECTRODE REM PT RTRN 9FT ADLT (ELECTROSURGICAL) ×1 IMPLANT
FORCEPS CUTTING 33CM 5MM (CUTTING FORCEPS) IMPLANT
GAUZE PACKING 1 X5 YD ST (GAUZE/BANDAGES/DRESSINGS) ×3 IMPLANT
GLOVE BIOGEL PI IND STRL 6.5 (GLOVE) ×1 IMPLANT
GLOVE BIOGEL PI IND STRL 7.0 (GLOVE) ×4 IMPLANT
GLOVE BIOGEL PI INDICATOR 6.5 (GLOVE) ×2
GLOVE BIOGEL PI INDICATOR 7.0 (GLOVE) ×8
GLOVE ECLIPSE 6.5 STRL STRAW (GLOVE) ×3 IMPLANT
HEMOSTAT ARISTA ABSORB 3G PWDR (MISCELLANEOUS) ×3 IMPLANT
LEGGING LITHOTOMY PAIR STRL (DRAPES) ×3 IMPLANT
LIGASURE IMPACT 36 18CM CVD LR (INSTRUMENTS) ×3 IMPLANT
LIGASURE VESSEL 5MM BLUNT TIP (ELECTROSURGICAL) ×3 IMPLANT
NEEDLE INSUFFLATION 120MM (ENDOMECHANICALS) ×3 IMPLANT
NS IRRIG 1000ML POUR BTL (IV SOLUTION) ×3 IMPLANT
PACK LAVH (CUSTOM PROCEDURE TRAY) ×3 IMPLANT
PACK ROBOTIC GOWN (GOWN DISPOSABLE) ×3 IMPLANT
PACK TRENDGUARD 450 HYBRID PRO (MISCELLANEOUS) IMPLANT
PACK TRENDGUARD 600 HYBRD PROC (MISCELLANEOUS) IMPLANT
PROTECTOR NERVE ULNAR (MISCELLANEOUS) ×6 IMPLANT
SCISSORS LAP 5X35 DISP (ENDOMECHANICALS) ×3 IMPLANT
SET IRRIG TUBING LAPAROSCOPIC (IRRIGATION / IRRIGATOR) ×3 IMPLANT
SLEEVE XCEL OPT CAN 5 100 (ENDOMECHANICALS) ×3 IMPLANT
SOLUTION ELECTROLUBE (MISCELLANEOUS) IMPLANT
SUT VIC AB 0 CT1 18XCR BRD8 (SUTURE) ×2 IMPLANT
SUT VIC AB 0 CT1 36 (SUTURE) ×9 IMPLANT
SUT VIC AB 0 CT1 8-18 (SUTURE) ×4
SUT VIC AB 3-0 SH 27 (SUTURE)
SUT VIC AB 3-0 SH 27X BRD (SUTURE) IMPLANT
SUT VICRYL 0 TIES 12 18 (SUTURE) ×3 IMPLANT
SUT VICRYL 0 UR6 27IN ABS (SUTURE) IMPLANT
SUT VICRYL 4-0 PS2 18IN ABS (SUTURE) ×3 IMPLANT
TIP UTERINE 6.7X8CM BLUE DISP (MISCELLANEOUS) ×3 IMPLANT
TOWEL OR 17X24 6PK STRL BLUE (TOWEL DISPOSABLE) ×3 IMPLANT
TRAY FOLEY CATH SILVER 14FR (SET/KITS/TRAYS/PACK) ×3 IMPLANT
TRENDGUARD 450 HYBRID PRO PACK (MISCELLANEOUS)
TRENDGUARD 600 HYBRID PROC PK (MISCELLANEOUS)
TROCAR OPTI TIP 5M 100M (ENDOMECHANICALS) ×3 IMPLANT
TROCAR XCEL DIL TIP R 11M (ENDOMECHANICALS) ×3 IMPLANT
WARMER LAPAROSCOPE (MISCELLANEOUS) ×3 IMPLANT

## 2017-01-12 NOTE — Progress Notes (Signed)
Vaginal packing removed as ordered. Very scant amount dark vaginal drainage noted on packing.  Patient tolerated fair.

## 2017-01-12 NOTE — Anesthesia Postprocedure Evaluation (Signed)
Anesthesia Post Note  Patient: Kelly Osborne  Procedure(s) Performed: Procedure(s) (LRB): LAPAROSCOPIC ASSISTED VAGINAL HYSTERECTOMY WITH SALPINGECTOMY (Bilateral)     Patient location during evaluation: PACU Anesthesia Type: General Level of consciousness: awake Pain management: pain level controlled Vital Signs Assessment: post-procedure vital signs reviewed and stable Respiratory status: spontaneous breathing Cardiovascular status: stable Postop Assessment: no signs of nausea or vomiting Anesthetic complications: no    Last Vitals:  Vitals:   01/12/17 1049 01/12/17 1443  BP: (!) 124/91 (!) 119/55  Pulse: 94 (!) 110  Resp: 20 16  Temp: 37.2 C 37.2 C    Last Pain:  Vitals:   01/12/17 1443  TempSrc:   PainSc: 3    Pain Goal: Patients Stated Pain Goal: 4 (01/12/17 1049)               [place]

## 2017-01-12 NOTE — Brief Op Note (Signed)
01/12/2017  2:42 PM  PATIENT:  Kelly Osborne  41 y.o. female  PRE-OPERATIVE DIAGNOSIS:  Dysfunctional Uterine Bleeding, Fibroid Uterus  POST-OPERATIVE DIAGNOSIS:  Dysfunctional Uterine Bleeding, Fibroid Uterus  PROCEDURE:  LAVH bilaterall salpingectomy  SURGEON:  Surgeon(s) and Role:    * Servando Salina, MD - Primary  PHYSICIAN ASSISTANT:   ASSISTANTS: Artelia Laroche, CNM   ANESTHESIA:   general FINDINGS; nl tubes and ovaries, fibroid uterus, nl appendix, liver edge, no endometriosis noted. Ureters peristasis noted bilaterally  EBL:  Total I/O In: 1100 [I.V.:1100] Out: 150 [Urine:100; Blood:50]  BLOOD ADMINISTERED:none  DRAINS: none   LOCAL MEDICATIONS USED:  MARCAINE     SPECIMEN:  Source of Specimen:  uterus with cervix, tubes  DISPOSITION OF SPECIMEN:  PATHOLOGY  COUNTS:  YES  TOURNIQUET:  * No tourniquets in log *  DICTATION: .Other Dictation: Dictation Number [id number]  PLAN OF CARE: Discharge to home after PACU  PATIENT DISPOSITION:  PACU - hemodynamically stable.   Delay start of Pharmacological VTE agent (>24hrs) due to surgical blood loss or risk of bleeding: no

## 2017-01-12 NOTE — Transfer of Care (Signed)
Immediate Anesthesia Transfer of Care Note  Patient: Kelly Osborne  Procedure(s) Performed: Procedure(s): LAPAROSCOPIC ASSISTED VAGINAL HYSTERECTOMY WITH SALPINGECTOMY (Bilateral)  Patient Location: PACU  Anesthesia Type:General  Level of Consciousness: awake, alert , oriented and patient cooperative  Airway & Oxygen Therapy: Patient Spontanous Breathing and Patient connected to nasal cannula oxygen  Post-op Assessment: Report given to RN and Post -op Vital signs reviewed and stable  Post vital signs: Reviewed and stable  Last Vitals:  Vitals:   01/12/17 1049  BP: (!) 124/91  Pulse: 94  Resp: 20  Temp: 37.2 C    Last Pain:  Vitals:   01/12/17 1049  TempSrc: Oral      Patients Stated Pain Goal: 4 (87/86/76 7209)  Complications: No apparent anesthesia complications

## 2017-01-12 NOTE — Anesthesia Preprocedure Evaluation (Signed)
Anesthesia Evaluation  Patient identified by MRN, date of birth, ID band Patient awake    Reviewed: Allergy & Precautions, H&P , NPO status , Patient's Chart, lab work & pertinent test results  Airway Mallampati: II  TM Distance: >3 FB Neck ROM: Full    Dental no notable dental hx. (+) Teeth Intact   Pulmonary neg pulmonary ROS,    Pulmonary exam normal breath sounds clear to auscultation       Cardiovascular negative cardio ROS Normal cardiovascular exam Rhythm:Regular Rate:Normal     Neuro/Psych negative neurological ROS  negative psych ROS   GI/Hepatic negative GI ROS, Neg liver ROS,   Endo/Other  diabetes, Gestational  Renal/GU negative Renal ROS  negative genitourinary   Musculoskeletal negative musculoskeletal ROS (+)   Abdominal Normal abdominal exam  (+)   Peds negative pediatric ROS (+)  Hematology negative hematology ROS (+)   Anesthesia Other Findings   Reproductive/Obstetrics negative OB ROS                             Anesthesia Physical  Anesthesia Plan  ASA: II  Anesthesia Plan: General   Post-op Pain Management:    Induction: Intravenous  PONV Risk Score and Plan: 4 or greater and Ondansetron, Dexamethasone, Propofol, Midazolam and Scopolamine patch - Pre-op  Airway Management Planned: LMA  Additional Equipment:   Intra-op Plan:   Post-operative Plan: Extubation in OR  Informed Consent: I have reviewed the patients History and Physical, chart, labs and discussed the procedure including the risks, benefits and alternatives for the proposed anesthesia with the patient or authorized representative who has indicated his/her understanding and acceptance.   Dental advisory given  Plan Discussed with: CRNA and Surgeon  Anesthesia Plan Comments:         Anesthesia Quick Evaluation

## 2017-01-12 NOTE — Anesthesia Procedure Notes (Signed)
Procedure Name: Intubation Date/Time: 01/12/2017 11:51 AM Performed by: Jonna Munro Pre-anesthesia Checklist: Patient identified, Emergency Drugs available, Suction available, Patient being monitored and Timeout performed Patient Re-evaluated:Patient Re-evaluated prior to induction Oxygen Delivery Method: Circle system utilized Preoxygenation: Pre-oxygenation with 100% oxygen Induction Type: IV induction Ventilation: Mask ventilation without difficulty Laryngoscope Size: Mac and 3 Grade View: Grade I Tube type: Oral Tube size: 7.0 mm Number of attempts: 1 Airway Equipment and Method: Stylet Placement Confirmation: ETT inserted through vocal cords under direct vision,  positive ETCO2 and breath sounds checked- equal and bilateral Secured at: 20 cm Tube secured with: Tape Dental Injury: Teeth and Oropharynx as per pre-operative assessment

## 2017-01-13 ENCOUNTER — Encounter (HOSPITAL_COMMUNITY): Payer: Self-pay | Admitting: Obstetrics and Gynecology

## 2017-01-13 DIAGNOSIS — N938 Other specified abnormal uterine and vaginal bleeding: Secondary | ICD-10-CM | POA: Diagnosis not present

## 2017-01-13 LAB — BASIC METABOLIC PANEL
ANION GAP: 6 (ref 5–15)
BUN: 7 mg/dL (ref 6–20)
CHLORIDE: 107 mmol/L (ref 101–111)
CO2: 26 mmol/L (ref 22–32)
Calcium: 8.4 mg/dL — ABNORMAL LOW (ref 8.9–10.3)
Creatinine, Ser: 0.91 mg/dL (ref 0.44–1.00)
GFR calc Af Amer: >60 mL/min (ref >60)
GLUCOSE: 120 mg/dL — AB (ref 65–99)
POTASSIUM: 3.7 mmol/L (ref 3.5–5.1)
Sodium: 139 mmol/L (ref 135–145)

## 2017-01-13 LAB — CBC
HCT: 32.9 % — ABNORMAL LOW (ref 36.0–46.0)
HEMOGLOBIN: 10.6 g/dL — AB (ref 12.0–15.0)
MCH: 27.9 pg (ref 26.0–34.0)
MCHC: 32.2 g/dL (ref 30.0–36.0)
MCV: 86.6 fL (ref 78.0–100.0)
PLATELETS: 330 10*3/uL (ref 150–400)
RBC: 3.8 MIL/uL — AB (ref 3.87–5.11)
RDW: 13.8 % (ref 11.5–15.5)
WBC: 12.5 10*3/uL — AB (ref 4.0–10.5)

## 2017-01-13 MED ORDER — OXYCODONE-ACETAMINOPHEN 5-325 MG PO TABS: 1.0000 | ORAL_TABLET | ORAL | 0 refills | Status: DC | PRN

## 2017-01-13 MED ORDER — IBUPROFEN 800 MG PO TABS: 800.0000 mg | ORAL_TABLET | Freq: Three times a day (TID) | ORAL | 0 refills | Status: DC | PRN

## 2017-01-13 NOTE — Progress Notes (Signed)
Subjective: Patient reports tolerating PO and no problems voiding.    Objective: I have reviewed patient's vital signs.  vital signs, intake and output and labs. Vitals:   01/13/17 0340 01/13/17 0739  BP: 104/64 110/65  Pulse: 76 76  Resp: 18 18  Temp: 98.7 F (37.1 C) 98.1 F (36.7 C)   I/O last 3 completed shifts: In: 2672.9 [I.V.:2672.9] Out: 1550 [Urine:1500; Blood:50] Total I/O In: -  Out: 300 [Urine:300]  Lab Results  Component Value Date   WBC 12.5 (H) 01/13/2017   HGB 10.6 (L) 01/13/2017   HCT 32.9 (L) 01/13/2017   MCV 86.6 01/13/2017   PLT 330 01/13/2017   Lab Results  Component Value Date   CREATININE 0.91 01/13/2017    EXAM General: alert, cooperative and no distress Resp: clear to auscultation bilaterally Cardio: regular rate and rhythm grade 2/6 SEM GI: soft nondistended (+) BS Extremities: no edema, redness or tenderness in the calves or thighs Vaginal Bleeding: none Back (-) CVAT Assessment: s/p Procedure(s): LAPAROSCOPIC ASSISTED VAGINAL HYSTERECTOMY WITH SALPINGECTOMY: stable, progressing well and tolerating diet  Plan: Encourage ambulation Discontinue IV fluids Discharge home  D/c instructions reviewed F/u 2 wks  LOS: 0 days    Montario Zilka A, MD 01/13/2017 9:17 AM    01/13/2017, 9:17 AM

## 2017-01-13 NOTE — Progress Notes (Signed)
Discharge teaching is completed. Pt and husband state understanding information.

## 2017-01-13 NOTE — Discharge Summary (Signed)
Physician Discharge Summary  Patient ID: Kelly Osborne MRN: [id number] DOB/AGE: Jul 21, 1975 41 y.o.  Admit date: 01/12/2017 Discharge date: 01/13/2017  Admission Diagnoses: DUB, fibroid uterus  Discharge Diagnoses: DUB, fibroid uterus Active Problems:   S/P vaginal hysterectomy bilateral salpingectomy  Discharged Condition: stable  Hospital Course: pt underwent LAVH bilateral salpingectomy. Path pending. Uncomplicated postoperative course  Consults: None  Significant Diagnostic Studies: labs:  CBC    Component Value Date/Time   WBC 12.5 (H) 01/13/2017 0525   RBC 3.80 (L) 01/13/2017 0525   HGB 10.6 (L) 01/13/2017 0525   HCT 32.9 (L) 01/13/2017 0525   PLT 330 01/13/2017 0525   MCV 86.6 01/13/2017 0525   MCH 27.9 01/13/2017 0525   MCHC 32.2 01/13/2017 0525   RDW 13.8 01/13/2017 0525    CMP Latest Ref Rng & Units 01/13/2017 01/09/2017  Glucose 65 - 99 mg/dL 120(H) 169(H)  BUN 6 - 20 mg/dL 7 8  Creatinine 0.44 - 1.00 mg/dL 0.91 0.88  Sodium 135 - 145 mmol/L 139 137  Potassium 3.5 - 5.1 mmol/L 3.7 3.6  Chloride 101 - 111 mmol/L 107 105  CO2 22 - 32 mmol/L 26 26  Calcium 8.9 - 10.3 mg/dL 8.4(L) 8.8(L)    Treatments: surgery: LAVH bilateral salpingectomy  Discharge Exam: Blood pressure 110/65, pulse 76, temperature 98.1 F (36.7 C), temperature source Oral, resp. rate 18, height 5\' 5"  (1.651 m), weight 77.1 kg (170 lb), last menstrual period 12/23/2016, SpO2 96 %. General appearance: alert, cooperative and no distress Back: no tenderness to percussion or palpation Resp: clear to auscultation bilaterally Breasts: normal appearance, no masses or tenderness GI: soft nondistended (+) BS Pelvic: deferred Extremities: no edema, redness or tenderness in the calves or thighs Incision/Wound: well approximated. Glue on. No erythema or induration  Disposition: 01-Home or Self Care  Discharge Instructions    Call MD for:  persistant nausea and vomiting    Complete by:  As  directed    Call MD for:  severe uncontrolled pain    Complete by:  As directed    Call MD for:  temperature >100.4    Complete by:  As directed    Diet general    Complete by:  As directed    Discharge instructions    Complete by:  As directed    Call if temperature greater than equal to 100.4, nothing per vagina for 4-6 weeks or severe nausea vomiting, increased incisional pain , drainage or redness in the incision site, no straining with bowel movements, showers no bath   May walk up steps    Complete by:  As directed      Allergies as of 01/13/2017      Reactions   Sulfa Antibiotics Rash      Medication List    TAKE these medications   ibuprofen 800 MG tablet Commonly known as:  ADVIL,MOTRIN Take 1 tablet (800 mg total) by mouth every 8 (eight) hours as needed (mild pain).   oxyCODONE-acetaminophen 5-325 MG tablet Commonly known as:  PERCOCET/ROXICET Take 1-2 tablets by mouth every 4 (four) hours as needed for severe pain (moderate to severe pain (when tolerating fluids)).      Follow-up Information    Servando Salina, MD Follow up in 2 week(s).   Specialty:  Obstetrics and Gynecology Contact information: [address] [place] [address] [phone]           Signed: Servando Salina A 01/13/2017, 9:20 AM

## 2017-01-13 NOTE — Op Note (Signed)
NAME:  Kelly Osborne, Kelly Osborne                      ACCOUNT NO.:  MEDICAL RECORD NO.:  [id number]  LOCATION:                                 FACILITY:  PHYSICIAN:  Servando Salina, M.D.DATE OF BIRTH:  12-Jun-1976  DATE OF PROCEDURE:  01/12/2017 DATE OF DISCHARGE:                              OPERATIVE REPORT   PREOPERATIVE DIAGNOSES: 1. Dysfunctional uterine bleeding. 2. Fibroid uterus.  PROCEDURES: 1. Laparoscopic-assisted vaginal hysterectomy,bilteral salpingectomy.  POSTOPERATIVE DIAGNOSES: 1. Dysfunctional uterine bleeding. 2. Fibroid uterus.  ANESTHESIA:  General.  SURGEON:  Servando Salina, M.D.  ASSISTANT:  Artelia Laroche, C.N.M.  DESCRIPTION OF PROCEDURE:  Under adequate general anesthesia, the patient was placed in dorsal lithotomy position.  She was sterilely prepped and draped in the usual fashion.  An indwelling Foley catheter was sterilely placed.  Examination under anesthesia revealed a retroverted uterus.  No adnexal masses could be appreciated.  A weighted speculum was placed in the vagina, Sims retractor was placed anteriorly. The cervix was grasped with a single-tooth tenaculum.  The cervix was dilated.  Uterus sounded to 9 cm.  An 8 mm uterine manipulator was then placed in the uterus without incident.  The retractor was removed and attention was then turned to the abdomen.  Marcaine 0.25% was injected infraumbilically.  An infraumbilical skin incision was made in a vertical fashion.  Veress needle was introduced.  Opening pressure of 1 was noted.  The Veress needle had been tested with a water drop method and 2.1 L of CO2 was then insufflated.  Veress needle was then removed. A 10 mm disposable trocar with sleeve was introduced in the abdomen without incident.  A lighted videolaparoscope was inserted, confirmed the entry into the abdomen without incident.  Panoramic inspection was initially started and normal liver edge and gallbladder was noted.   The patient was then placed in Trendelenburg position.  Manipulation of the uterus resulted in the both adnexa being seen.  No evidence of endometriosis in anterior or posterior cul-de-sac.  At that point, 0.25% Marcaine was injected in the right and left lower quadrant.  An incision was then made and 5 mm ports were placed under direct visualization. Using the probe, the pelvis was further inspected.  Both ureters could be seen peristalsing well.  The procedure at that point was then started with grasping the left fallopian tube.  The underlying mesosalpinx was then serially clamped, cauterized, and then cut using the LigaSure.  The fallopian tube was removed.  The retroperitoneal space was opened on the left.  The left utero-ovarian ligament was serially clamped, cauterized, and then cut.  The round ligaments were then clamped, cauterized, and cut.  Anteriorly, the vesicouterine peritoneum was opened with hot scissors and the bladder was displaced inferiorly.  The remaining portion of the bladder attachment was then done.  The same procedure was performed on the contralateral side with the fallopian tube remaining in the pelvis as it was too big to come through the port.  Once the bladder was down, both adnexa with good hemostasis.  The instruments were removed from the vagina.  The ports remained and attention was then turned to the  vagina.  The uterine manipulators were removed.  Weighted speculum and Sims retractor were then placed.  The cervicovaginal junction was identified.  Jacobsen clamps were placed on the anterior and posterior aspects of the cervix.  Lidocaine 1% with epinephrine with 1:200,000 dilution was injected at the cervicovaginal junction.  Cervicovaginal incision was then made.  With sharp dissection posteriorly, the posterior cul-de-sac was subsequently opened.  The uterosacral ligaments were then bilaterally clamped, cut, and suture ligated with 0 Vicryl  suture. Anteriorly, the anterior cul-de-sac was then opened after sharp dissection.  The retractor then displaced the bladder upwardly and the remaining bilateral attachment using the gyrus was bilaterally serially clamped with the uterine vessels, the cardinal ligaments serially clamped, cauterized, and then cut until the uterus was detached. Posteriorly, prior to placing of the retractors, the posterior cuff had been oversewn with 0 Vicryl suture in a running lock stitch.  Inspection of vaginal cuff noted the bleeding on the posterior cuff with additional figure-of-eight sutures being placed.  Once that was done, the bladder peritoneum anteriorly was identified using 0 Vicryl suture from the left uterosacral ligament across and posteriorly, McCall culdoplasty was performed.  The vagina was then closed with the uterosacral ligament pedicles tied in the midline and the vaginal cuff incision was closed vertically with 0 Vicryl figure-of-eight interrupted sutures.  Once this was done, attention was then taken back to the abdomen which was reinsufflated.  The lighted videolaparoscope was reinserted.  The pelvis was inspected.  Good hemostasis was noted.  Ureters were still seen Peristalsing. The left fallopian tube was then removed through the umbilical port site. The pelvis was irrigated and suctioned.  Arista potato starch was then placed overlying the vaginal cuff and the procedure was terminated by removing all instruments and deflating the abdomen.  The incisions were closed with 0 Vicryl figure-of-eight sutures at the umbilical fascial site and the remaining incisions were closed with 4-0 Monocryl and/or 4-0 Vicryl subcuticular closures.  The vagina was irrigated and estrogen covered vaginal packing was then placed.  COMPLICATION:  None  SPECIMEN:  Uterus with cervix, tubes sent to Pathology.  ESTIMATED BLOOD LOSS:  50 mL.  INTRAOPERATIVE FLUID:  1100 mL.  URINE OUTPUT:  100 mL clear  yellow urine.  COUNTS:  Sponge and instrument counts x2 were correct.  COMPLICATIONS:  None.  CONDITION:  The patient tolerated the procedure well and was transferred to recovery room in stable condition.     Servando Salina, M.D.     [place]/[facility]  D:  01/12/2017  T:  01/12/2017  Job:  [id number]

## 2017-01-27 ENCOUNTER — Other Ambulatory Visit: Payer: Self-pay | Admitting: Obstetrics and Gynecology

## 2018-06-29 ENCOUNTER — Encounter: Payer: Self-pay | Admitting: Internal Medicine

## 2018-07-23 ENCOUNTER — Other Ambulatory Visit: Payer: Self-pay | Admitting: Obstetrics and Gynecology

## 2018-07-23 DIAGNOSIS — E049 Nontoxic goiter, unspecified: Secondary | ICD-10-CM

## 2018-07-26 ENCOUNTER — Ambulatory Visit
Admission: RE | Admit: 2018-07-26 | Discharge: 2018-07-26 | Disposition: A | Discharge status: Home / Self Care | Payer: Managed Care, Other (non HMO) | Source: Ambulatory Visit | Attending: Obstetrics and Gynecology | Admitting: Obstetrics and Gynecology

## 2018-07-26 DIAGNOSIS — E049 Nontoxic goiter, unspecified: Secondary | ICD-10-CM

## 2018-08-01 ENCOUNTER — Other Ambulatory Visit: Payer: Self-pay | Admitting: Obstetrics and Gynecology

## 2018-08-01 DIAGNOSIS — E049 Nontoxic goiter, unspecified: Secondary | ICD-10-CM

## 2018-08-28 ENCOUNTER — Encounter: Payer: Self-pay | Admitting: Internal Medicine

## 2018-08-28 ENCOUNTER — Ambulatory Visit (INDEPENDENT_AMBULATORY_CARE_PROVIDER_SITE_OTHER): Payer: Managed Care, Other (non HMO) | Admitting: Internal Medicine

## 2018-08-28 VITALS — BP 110/80 | HR 88 | Ht 64.0 in | Wt 175.0 lb

## 2018-08-28 DIAGNOSIS — E049 Nontoxic goiter, unspecified: Secondary | ICD-10-CM | POA: Diagnosis not present

## 2018-08-28 DIAGNOSIS — E041 Nontoxic single thyroid nodule: Secondary | ICD-10-CM | POA: Diagnosis not present

## 2018-08-28 NOTE — Patient Instructions (Addendum)
Please come back in 1 year.  If you remember, call me before next visit to order the reorder the U/S.

## 2018-08-28 NOTE — Progress Notes (Signed)
Patient ID: Kelly Osborne, female   DOB: 05/08/1976, 43 y.o.   MRN: [id number]    HPI  Kelly Osborne is a 43 y.o.-year-old female, referred by her OB/GYN provider, Dr. Gustavo Lah, NP, for evaluation for goiter and thyroid nodule.  Patient was found to have a goiter during evaluation by OB/GYN.  She was sent to have a thyroid ultrasound which showed a fairly normal-sized thyroid, with the left lobe slightly larger than the right, containing a 1.2 cm thyroid nodule.  Thyroid U/S (07/26/2018): Normal-sized thyroid with 1 nodule versus pseudo-nodule of 1.2 cm:  Parenchymal Echotexture: Mildly heterogenous Isthmus: 0.4 cm Right lobe: 4.9 x 1.5 x 1.9 cm Left lobe: 5.2 x 1.5 x 1.7 cm _________________________________________________________  Nodule # 1: Location: Right; Inferior Maximum size: 1.2 cm; Other 2 dimensions: 0.5 x 0.9 cm Composition: solid/almost completely solid (2) Echogenic foci: macrocalcifications (1)  *Given size (>/= 1 - 1.4 cm) and appearance, a follow-up ultrasound in 1 year should be considered based on TI-RADS criteria. _________________________________________________  IMPRESSION: 1. Mildly heterogeneous and enlarged thyroid gland. 2. A 1.2 cm TI-RADS category 4 (moderately suspicious) nodule in the medial aspect of the right inferior gland meets criteria for follow-up ultrasound at 1 year. Of note, on some of the images, this "nodule" appears more like a posterior lobulation of the normal thyroid tissue that happens to have an adjacent dystrophic calcification. This may actually represent a pseudonodule.     Pt denies: - feeling nodules in neck - hoarseness - dysphagia - choking - SOB with lying down  I reviewed pt's thyroid tests: 06/29/2018: TSH 1.170, free T4 0.99 09/07/2016: TSH 1.170, free T4 1.22 08/11/2008: TSH 1.6 No results found for: TSH, FREET4   Pt denies: - fatigue - heat intolerance/cold intolerance - tremors - anxiety/depression -  hyperdefecation/constipation - hair loss But does admit for weight gain. She also has occasional palpitations.  She has a history of heart murmur.  + FH of thyroid ds.- MGM with hypothyroidism. No FH of thyroid cancer. No h/o radiation tx to head or neck.  No seaweed or kelp. No recent contrast studies. No steroid use. No herbal supplements. No Biotin supplements or Hair, Skin and Nails vitamins. + Essential oils. Now on elderberry gummy's.  Pt also has a history of gestational diabetes.  She had a partial hysterectomy 01/2017, after a uterine ablation was unsuccessful for management of uterine fibroids.  ROS: Constitutional: + See HPI, + nocturia Eyes: no blurry vision, no xerophthalmia ENT: no sore throat,  + see HPI, + hyperacusis Cardiovascular: no CP/SOB/palpitations/leg swelling Respiratory: no cough/SOB Gastrointestinal: no N/V/+ D/+ C Musculoskeletal: no muscle/joint aches Skin: + Rash and itching on upper arms with certain foods. Neurological: no tremors/numbness/tingling/dizziness Psychiatric: no depression/anxiety  Past Medical History:  Diagnosis Date  . Gestational diabetes   . Heart murmur    mild, noted with pregnancy   Past Surgical History:  Procedure Laterality Date  . DILATATION & CURETTAGE/HYSTEROSCOPY WITH MYOSURE N/A 07/21/2016   Procedure: [place];  Surgeon: Servando Salina, MD;  Location: [facility];  Service: Gynecology;  Laterality: N/A;  . HERNIA REPAIR     childhood  . HYSTEROSCOPY N/A 07/21/2016   Procedure: HYSTEROSCOPY WITH HYDROTHERMAL ABLATION;  Surgeon: Servando Salina, MD;  Location: [facility];  Service: Gynecology;  Laterality: N/A;  . LAPAROSCOPIC VAGINAL HYSTERECTOMY WITH SALPINGECTOMY Bilateral 01/12/2017   Procedure: LAPAROSCOPIC ASSISTED VAGINAL HYSTERECTOMY WITH SALPINGECTOMY;  Surgeon: Servando Salina, MD;  Location: [facility];  Service: Gynecology;  Laterality: Bilateral;  . MYRINGOTOMY    .  WISDOM TOOTH EXTRACTION     Social History   Socioeconomic History  . Marital status: Married    Spouse name: Not on file  . Number of children: 2  . Years of education: Not on file  . Highest education level: Not on file  Occupational History  .  Engineer, maintenance (IT), [employer] administrator  . Smoking status: Never Smoker  . Smokeless tobacco: Never Used  Substance and Sexual Activity  . Alcohol use: Yes    Comment: rare  . Drug use: No   She is not taking any prescription medications.  Allergies  Allergen Reactions  . Sulfa Antibiotics Rash   Family history: Diabetes mellitus in great grandmothers, great aunts and uncles HTN in grandfather, great aunt hyperlipidemia and grandmother Heart disease in grandfather and great-grandmother, heart murmur in mother and brother Lung and liver cancer in grandfather Thyroid disease in the grandmother   PE: BP 110/80   Pulse 88   Ht 5\' 4"  (1.626 m)   Wt 175 lb (79.4 kg)   LMP 12/23/2016 (Exact Date)   SpO2 98%   BMI 30.04 kg/m  Wt Readings from Last 3 Encounters:  08/28/18 175 lb (79.4 kg)  01/12/17 170 lb (77.1 kg)  01/09/17 170 lb 4 oz (77.2 kg)   Constitutional: overweight, in NAD Eyes: PERRLA, EOMI, no exophthalmos ENT: moist mucous membranes, no thyromegaly, but left thyroid lobe palpable, no cervical lymphadenopathy Cardiovascular: RRR, No MRG Respiratory: CTA B Gastrointestinal: abdomen soft, NT, ND, BS+ Musculoskeletal: no deformities, strength intact in all 4;  Skin: moist, warm, no rashes Neurological: no tremor with outstretched hands, DTR normal in all 4  ASSESSMENT: 1. Thyroid nodule  2.  Goiter  PLAN: 1. Thyroid nodule - I reviewed the images of her thyroid ultrasound along with the patient. I pointed out that the dominant nodule is small, and it is possible that it is an inflammatory nodule (pseudo-nodule). Otherwise, the nodule is: - not hypoechoic - without microcalcifications, but does have  macrocalcifications - without internal blood flow - more wide than tall - well delimited from surrounding tissue Pt does not have a thyroid cancer family history or a personal history of RxTx to head/neck. All these would favor benignity.  - We discussed about following this nodule by repeating the thyroid ultrasound in another year.  Stability over 5 years will ensure benignity. - she should let me know if she develops neck compression symptoms, in that case, we might need to do thyroid lobectomy - I did explain that, while thyroid surgery is not a complicated one, it still can have side effects and also she might have a risk of ~25% of becoming hypothyroid after hemithyroidectomy.  - We will not check TFTs today since recent levels have been normal per review of records from OB/GYN. - I advised pt to join my chart and send me a message through my chart before her next appointment so I can order a new thyroid ultrasound which we can then review at next visit.  2.  Goiter -Thyroid size appears normal on the ultrasound, with the left lobe slightly larger than the right and palpable -No intervention needed for this, especially since she had no neck compression symptoms  Philemon Kingdom, MD PhD [facility] Endocrinology

## 2018-09-19 ENCOUNTER — Telehealth: Payer: Self-pay | Admitting: Internal Medicine

## 2018-09-19 NOTE — Telephone Encounter (Signed)
Chart note faxed as requested

## 2018-09-19 NOTE — Telephone Encounter (Signed)
The Referring office [facility] is requesting the office notes to be sent to them for the patients NEW PATIENT visit   [place]- [phone]

## 2019-08-09 ENCOUNTER — Telehealth: Payer: Self-pay

## 2019-08-09 ENCOUNTER — Other Ambulatory Visit: Payer: Self-pay | Admitting: Internal Medicine

## 2019-08-09 DIAGNOSIS — E041 Nontoxic single thyroid nodule: Secondary | ICD-10-CM

## 2019-08-09 NOTE — Telephone Encounter (Signed)
Patient called states that Dr Cruzita Lederer wanted her to have an ultrasound of her thyroid before her appointment on 08-29-19.  She was told to call the beginning of February to get order sent. Please call patient

## 2019-08-09 NOTE — Telephone Encounter (Signed)
Done. C 

## 2019-08-13 NOTE — Telephone Encounter (Signed)
Notified patient order has been placed and provided the phone number for her to call and schedule.

## 2019-08-20 ENCOUNTER — Ambulatory Visit
Admission: RE | Admit: 2019-08-20 | Discharge: 2019-08-20 | Disposition: A | Discharge status: Home / Self Care | Payer: Managed Care, Other (non HMO) | Source: Ambulatory Visit | Attending: Internal Medicine | Admitting: Internal Medicine

## 2019-08-20 DIAGNOSIS — E041 Nontoxic single thyroid nodule: Secondary | ICD-10-CM

## 2019-08-29 ENCOUNTER — Other Ambulatory Visit: Payer: Self-pay

## 2019-08-29 ENCOUNTER — Encounter: Payer: Self-pay | Admitting: Internal Medicine

## 2019-08-29 ENCOUNTER — Ambulatory Visit (INDEPENDENT_AMBULATORY_CARE_PROVIDER_SITE_OTHER): Payer: Managed Care, Other (non HMO) | Admitting: Internal Medicine

## 2019-08-29 DIAGNOSIS — E049 Nontoxic goiter, unspecified: Secondary | ICD-10-CM

## 2019-08-29 DIAGNOSIS — E041 Nontoxic single thyroid nodule: Secondary | ICD-10-CM

## 2019-08-29 NOTE — Patient Instructions (Addendum)
Please schedule another appointment in 1 year.  Please send me a message before the visit to order the new thyroid ultrasound.

## 2019-08-29 NOTE — Progress Notes (Signed)
Patient ID: Kelly Osborne, female   DOB: 11/02/1975, 44 y.o.   MRN: [id number]   Patient location: Home My location: Office Persons participating in the virtual visit: patient, provider  Referring Provider: Gustavo Lah, NP  I connected with the patient on 08/29/19 at  8:03 AM EST by a video enabled telemedicine application and verified that I am speaking with the correct person.   I discussed the limitations of evaluation and management by telemedicine and the availability of in person appointments. The patient expressed understanding and agreed to proceed.   Details of the encounter are shown below.  HPI  Kelly Osborne is a 44 y.o.-year-old female, referred by her OB/GYN provider, Dr. Gustavo Lah, NP, for evaluation for goiter and thyroid nodule.  Last visit 1 year ago.  Patient was found to have a goiter during evaluation by GYN.  She was sent to have a thyroid ultrasound in 07/2018.  This showed a normal-sized thyroid with the left lobe slightly larger than the right, containing a 1.2 cm thyroid nodule.  At that time, it was unclear whether this was a true nodule versus a pseudonodule (inflammatory site).  Thyroid U/S (07/26/2018): Normal-sized thyroid with 1 nodule versus pseudo-nodule of 1.2 cm:  Parenchymal Echotexture: Mildly heterogenous Isthmus: 0.4 cm Right lobe: 4.9 x 1.5 x 1.9 cm Left lobe: 5.2 x 1.5 x 1.7 cm _________________________________________________________  Nodule # 1: Location: Right; Inferior Maximum size: 1.2 cm; Other 2 dimensions: 0.5 x 0.9 cm Composition: solid/almost completely solid (2) Echogenic foci: macrocalcifications (1) *Given size (>/= 1 - 1.4 cm) and appearance, a follow-up ultrasound in 1 year should be considered based on TI-RADS criteria. _________________________________________________  IMPRESSION: 1. Mildly heterogeneous and enlarged thyroid gland. 2. A 1.2 cm TI-RADS category 4 (moderately suspicious) nodule in the medial aspect  of the right inferior gland meets criteria for follow-up ultrasound at 1 year. Of note, on some of the images, this "nodule" appears more like a posterior lobulation of the normal thyroid tissue that happens to have an adjacent dystrophic calcification. This may actually represent a pseudonodule.     Thyroid U/S (08/20/2019): Parenchymal Echotexture: Normal Isthmus: 2 mm Right lobe: 4.8 x 1.1 x 2.0 cm Left lobe: 5.4 x 1.5 x 1.4 cm _________________________________________________________  Nodule # 1: Location: Right; Mid Maximum size: 1.4, previously 1.2 cm; Other 2 dimensions: 0.9 x 0.4 cm Composition: solid/almost completely solid (2) Echogenicity: hypoechoic (2) Echogenic foci: macrocalcifications (1) *Given size (>/= 1 - 1.4 cm) and appearance, a follow-up ultrasound in 1 year should be considered based on TI-RADS criteria. ______________________________________________________  There are a few scattered bilateral subcentimeter thyroid cystic nodules all measuring 5 mm or less in size.  Normal thyroid echogenicity. No hypervascularity or regional adenopathy.  IMPRESSION: 1.4 cm right mid thyroid TR 4 nodule meets criteria for follow-up in 1 year.    Reviewed patient's TFTs: 06/29/2018: TSH 1.170, free T4 0.99 09/07/2016: TSH 1.170, free T4 1.22 08/11/2008: TSH 1.6 No results found for: TSH, FREET4   Pt denies: - feeling nodules in neck - hoarseness - dysphagia - choking - SOB with lying down  + FH of thyroid ds.- MGM with hypothyroidism. No FH of thyroid cancer. No h/o radiation tx to head or neck.  No seaweed or kelp. No recent contrast studies. No herbal supplements. No Biotin use. No recent steroids use.   She is using essential oils. On vitamin D and Elderberry gummies.  She has a history of gestational diabetes.   She  had a partial hysterectomy 01/2017, after a uterine ablation was unsuccessful for management of uterine fibroids. She has a history of  heart murmur.  ROS: Constitutional: no weight gain/no weight loss, no fatigue, no subjective hyperthermia, no subjective hypothermia Eyes: no blurry vision, no xerophthalmia ENT: no sore throat, + see HPI Cardiovascular: no CP/no SOB/no palpitations/no leg swelling Respiratory: no cough/no SOB/no wheezing Gastrointestinal: no N/no V/no D/no C/no acid reflux Musculoskeletal: no muscle aches/no joint aches Skin: no rashes, no hair loss Neurological: no tremors/no numbness/no tingling/no dizziness  I reviewed pt's medications, allergies, PMH, social hx, family hx, and changes were documented in the history of present illness. Otherwise, unchanged from my initial visit note.  Past Medical History:  Diagnosis Date  . Gestational diabetes   . Heart murmur    mild, noted with pregnancy   Past Surgical History:  Procedure Laterality Date  . DILATATION & CURETTAGE/HYSTEROSCOPY WITH MYOSURE N/A 07/21/2016   Procedure: [place];  Surgeon: Servando Salina, MD;  Location: [facility];  Service: Gynecology;  Laterality: N/A;  . HERNIA REPAIR     childhood  . HYSTEROSCOPY N/A 07/21/2016   Procedure: HYSTEROSCOPY WITH HYDROTHERMAL ABLATION;  Surgeon: Servando Salina, MD;  Location: [facility];  Service: Gynecology;  Laterality: N/A;  . LAPAROSCOPIC VAGINAL HYSTERECTOMY WITH SALPINGECTOMY Bilateral 01/12/2017   Procedure: LAPAROSCOPIC ASSISTED VAGINAL HYSTERECTOMY WITH SALPINGECTOMY;  Surgeon: Servando Salina, MD;  Location: [facility];  Service: Gynecology;  Laterality: Bilateral;  . MYRINGOTOMY    . WISDOM TOOTH EXTRACTION     Social History   Socioeconomic History  . Marital status: Married    Spouse name: Not on file  . Number of children: 2  . Years of education: Not on file  . Highest education level: Not on file  Occupational History  .  Engineer, maintenance ([employer]), [employer] administrator  . Smoking status: Never Smoker  . Smokeless tobacco: Never Used  Substance  and Sexual Activity  . Alcohol use: Yes    Comment: rare  . Drug use: No   She is not taking any prescription medications.  Allergies  Allergen Reactions  . Sulfa Antibiotics Rash   Family history: Diabetes mellitus in great grandmothers, great aunts and uncles HTN in grandfather, great aunt hyperlipidemia and grandmother Heart disease in grandfather and great-grandmother, heart murmur in mother and brother Lung and liver cancer in grandfather Thyroid disease in the grandmother  PE: LMP 12/23/2016 (Exact Date)  Wt Readings from Last 3 Encounters:  08/28/18 175 lb (79.4 kg)  01/12/17 170 lb (77.1 kg)  01/09/17 170 lb 4 oz (77.2 kg)   Constitutional:  in NAD  The physical exam was not performed (virtual visit).  ASSESSMENT: 1. Thyroid nodule  2.  Goiter  PLAN: 1. Thyroid nodule -I reviewed the images of her thyroid ultrasound from 2020 in 2021.  The dominant nodule is small, only minimally larger on latest ultrasound.  It is hypoechoic and does contain macro calcifications, but otherwise, it does not have microcalcifications, internal blood flow, invaginations in the surrounding tissue or a taller than wide orientation. -Based on TI-RADS criteria, the nodule will need to be reevaluated in a year, but no biopsy is indicated for now. -She does not have a high TSH, or thyroid cancer family history of her a personal history of radiation therapy to head or neck.  All these would favor benignity. -At this point, she does not have neck compression symptoms but we did discuss about letting me know if these  occur. -For now we decided to follow the nodule by thyroid ultrasound for 5 years.  Stability over 5 years will ensure benignity. -I did explain that thyroid surgery is not necessary and, while this is not a complicated surgery, it can still have side effects and the risk of hypothyroidism -For now, I advised her to have another TSH checked at next visit with PCP but otherwise I  plan to follow up with her in a year.  I advised her to call me for the visit so I can order the new ultrasound  2.  Goiter -Neck compression symptoms -Thyroid size appears normal on ultrasound, with the left lobe slightly larger than the right and also palpable -We will continue to monitor her clinically  Philemon Kingdom, MD PhD [facility] Endocrinology

## 2019-09-24 DIAGNOSIS — N9089 Other specified noninflammatory disorders of vulva and perineum: Secondary | ICD-10-CM | POA: Diagnosis not present

## 2019-09-24 DIAGNOSIS — E049 Nontoxic goiter, unspecified: Secondary | ICD-10-CM | POA: Diagnosis not present

## 2019-09-24 DIAGNOSIS — Z6827 Body mass index (BMI) 27.0-27.9, adult: Secondary | ICD-10-CM | POA: Diagnosis not present

## 2019-09-24 DIAGNOSIS — Z1231 Encounter for screening mammogram for malignant neoplasm of breast: Secondary | ICD-10-CM | POA: Diagnosis not present

## 2019-09-24 DIAGNOSIS — Z13220 Encounter for screening for lipoid disorders: Secondary | ICD-10-CM | POA: Diagnosis not present

## 2019-09-24 DIAGNOSIS — Z130 Encounter for screening for diseases of the blood and blood-forming organs and certain disorders involving the immune mechanism: Secondary | ICD-10-CM | POA: Diagnosis not present

## 2019-09-24 DIAGNOSIS — Z90710 Acquired absence of both cervix and uterus: Secondary | ICD-10-CM | POA: Diagnosis not present

## 2019-09-24 DIAGNOSIS — Z0000 Encounter for general adult medical examination without abnormal findings: Secondary | ICD-10-CM | POA: Diagnosis not present

## 2019-09-24 DIAGNOSIS — Z01419 Encounter for gynecological examination (general) (routine) without abnormal findings: Secondary | ICD-10-CM | POA: Diagnosis not present

## 2019-09-24 DIAGNOSIS — Z131 Encounter for screening for diabetes mellitus: Secondary | ICD-10-CM | POA: Diagnosis not present

## 2020-01-29 DIAGNOSIS — R519 Headache, unspecified: Secondary | ICD-10-CM | POA: Diagnosis not present

## 2020-01-29 DIAGNOSIS — R05 Cough: Secondary | ICD-10-CM | POA: Diagnosis not present

## 2020-01-29 DIAGNOSIS — R509 Fever, unspecified: Secondary | ICD-10-CM | POA: Diagnosis not present

## 2020-01-29 DIAGNOSIS — Z20828 Contact with and (suspected) exposure to other viral communicable diseases: Secondary | ICD-10-CM | POA: Diagnosis not present

## 2020-08-05 ENCOUNTER — Telehealth: Payer: Self-pay | Admitting: Internal Medicine

## 2020-08-05 NOTE — Telephone Encounter (Signed)
Pt called to let us know she is 3 weeks from her appt and is requesting an order for her ultrasound

## 2020-08-06 ENCOUNTER — Other Ambulatory Visit: Payer: Self-pay | Admitting: Internal Medicine

## 2020-08-06 DIAGNOSIS — E041 Nontoxic single thyroid nodule: Secondary | ICD-10-CM

## 2020-08-06 NOTE — Telephone Encounter (Signed)
Done

## 2020-08-06 NOTE — Telephone Encounter (Signed)
Please advise 

## 2020-08-18 ENCOUNTER — Ambulatory Visit
Admission: RE | Admit: 2020-08-18 | Discharge: 2020-08-18 | Disposition: A | Discharge status: Home / Self Care | Payer: BC Managed Care – PPO | Source: Ambulatory Visit | Attending: Internal Medicine | Admitting: Internal Medicine

## 2020-08-18 DIAGNOSIS — E041 Nontoxic single thyroid nodule: Secondary | ICD-10-CM | POA: Diagnosis not present

## 2020-08-27 ENCOUNTER — Other Ambulatory Visit: Payer: Self-pay

## 2020-08-27 ENCOUNTER — Encounter: Payer: Self-pay | Admitting: Internal Medicine

## 2020-08-27 ENCOUNTER — Ambulatory Visit (INDEPENDENT_AMBULATORY_CARE_PROVIDER_SITE_OTHER): Payer: BC Managed Care – PPO | Admitting: Internal Medicine

## 2020-08-27 VITALS — BP 110/78 | HR 81 | Ht 64.0 in | Wt 159.4 lb

## 2020-08-27 DIAGNOSIS — E049 Nontoxic goiter, unspecified: Secondary | ICD-10-CM

## 2020-08-27 DIAGNOSIS — E041 Nontoxic single thyroid nodule: Secondary | ICD-10-CM | POA: Diagnosis not present

## 2020-08-27 LAB — TSH: TSH: 1.46 u[IU]/mL (ref 0.35–4.50)

## 2020-08-27 NOTE — Patient Instructions (Addendum)
Please schedule another appointment in 1 year.  Please stop at the lab.  We will schedule a thyroid biopsy - you will be called about this.   Thyroid Needle Biopsy  Thyroid needle biopsy is a procedure to remove small samples of tissue or fluid from the thyroid gland. The samples are then examined under a microscope. The thyroid is a gland in the lower front area of the neck. It produces hormones that affect many important body processes, including growth and development, body temperature, and how the body uses food for energy (metabolism). This procedure is often done to help diagnose cancer, infection, or other problems with the thyroid. During this procedure, a thin needle (fine needle) is inserted through the skin and into the thyroid gland. This is less invasive than a procedure in which an incision is made over the thyroid (open thyroid biopsy). Sometimes, an open thyroid biopsy may be done during a different surgery, such as surgery to remove a part or a whole section (lobe) of the thyroid gland (open lobectomy). Tell a health care provider about:  Any allergies you have.  All medicines you are taking, including vitamins, herbs, eye drops, creams, and over-the-counter medicines.  Any problems you or family members have had with anesthetic medicines.  Any blood disorders you have.  Any surgeries you have had.  Any medical conditions you have.  Whether you are pregnant or may be pregnant. What are the risks? Generally, this is a safe procedure. However, problems may occur, including:  Infection.  Bleeding.  Allergic reactions to medicines.  Damage to nerves or blood vessels in the neck. What happens before the procedure? Medicines  Ask your health care provider about: ? Changing or stopping your regular medicines. This is especially important if you are taking diabetes medicines or blood thinners. ? Taking medicines such as aspirin and ibuprofen. These medicines can thin  your blood. Do not take these medicines unless your health care provider tells you to take them. ? Taking over-the-counter medicines, vitamins, herbs, and supplements. General instructions  You may have blood tests.  You may have an ultrasound before or during the needle biopsy. What happens during the procedure?  You will be asked to lie on your back with your head tipped backward to extend your neck. You may be asked to avoid coughing, talking, swallowing, or making sounds during some parts of the procedure.  To lower your risk of infection: ? Your health care team will wash or sanitize their hands. ? The skin over your thyroid will be cleaned with a germ-killing (antiseptic) solution.  A local anesthetic (lidocaine) may be injected into the skin over your thyroid, to numb the area.  An ultrasound may be done to help guide the needle to the desired area of your thyroid.  A fine needle will be inserted into your thyroid. The needle will be used to remove tissue or fluid samples as needed. The samples will be sent to a lab for examination.  The needle will be removed.  Pressure may be applied to your neck to reduce swelling and stop bleeding. The procedure may vary among health care providers and hospitals. What happens after the procedure?  It is up to you to get the results of your procedure. Ask your health care provider, or the department that is doing the procedure, when your results will be ready. Summary  Thyroid needle biopsy is a procedure to remove small samples of tissue or fluid from the thyroid gland.  During this procedure, a thin needle (fine needle) is inserted through the skin and into the thyroid gland. This is less invasive than a procedure in which an incision is made over the thyroid (open thyroid biopsy).  You will be asked to lie on your back with your head tipped backward to extend your neck. You may be asked to avoid coughing, talking, swallowing, or making  sounds during some parts of the procedure. This information is not intended to replace advice given to you by your health care provider. Make sure you discuss any questions you have with your health care provider. Document Revised: 02/27/2020 Document Reviewed: 02/27/2020 Elsevier Patient Education  [place].

## 2020-08-27 NOTE — Progress Notes (Addendum)
Patient ID: Kelly Osborne, female   DOB: [birth year], 45 y.o.   MRN: [id number]   This visit occurred during the SARS-CoV-2 public health emergency.  Safety protocols were in place, including screening questions prior to the visit, additional usage of staff PPE, and extensive cleaning of exam room while observing appropriate contact time as indicated for disinfecting solutions.   HPI  Kelly Osborne is a 45 y.o.-year-old female, initially referred by her OB/GYN provider, Dr. Gustavo Lah, NP, returning for follow-up for goiter and thyroid nodule.  Last visit 1 year ago (virtual).  She had Covid19 2x in last year, last episode was last month-mild.  Reviewed history: Patient was found to have a goiter during evaluation by GYN.  She was sent to have a thyroid ultrasound in 07/2018.  This showed a normal-sized thyroid with the left lobe slightly larger than the right, containing a 1.2 cm thyroid nodule.  At that time, it was unclear whether this was a true nodule versus a pseudonodule (inflammatory site).  Thyroid U/S (07/26/2018): Normal-sized thyroid with 1 nodule versus pseudo-nodule of 1.2 cm:  Parenchymal Echotexture: Mildly heterogenous Isthmus: 0.4 cm Right lobe: 4.9 x 1.5 x 1.9 cm Left lobe: 5.2 x 1.5 x 1.7 cm _________________________________________________________  Nodule # 1: Location: Right; Inferior Maximum size: 1.2 cm; Other 2 dimensions: 0.5 x 0.9 cm Composition: solid/almost completely solid (2) Echogenic foci: macrocalcifications (1) *Given size (>/= 1 - 1.4 cm) and appearance, a follow-up ultrasound in 1 year should be considered based on TI-RADS criteria. _________________________________________________  IMPRESSION: 1. Mildly heterogeneous and enlarged thyroid gland. 2. A 1.2 cm TI-RADS category 4 (moderately suspicious) nodule in the medial aspect of the right inferior gland meets criteria for follow-up ultrasound at 1 year. Of note, on some of the images,  this "nodule" appears more like a posterior lobulation of the normal thyroid tissue that happens to have an adjacent dystrophic calcification. This may actually represent a pseudonodule.     Thyroid U/S (08/20/2019): Parenchymal Echotexture: Normal Isthmus: 2 mm Right lobe: 4.8 x 1.1 x 2.0 cm Left lobe: 5.4 x 1.5 x 1.4 cm _________________________________________________________  Nodule # 1: Location: Right; Mid Maximum size: 1.4, previously 1.2 cm; Other 2 dimensions: 0.9 x 0.4 cm Composition: solid/almost completely solid (2) Echogenicity: hypoechoic (2) Echogenic foci: macrocalcifications (1) *Given size (>/= 1 - 1.4 cm) and appearance, a follow-up ultrasound in 1 year should be considered based on TI-RADS criteria. ______________________________________________________  There are a few scattered bilateral subcentimeter thyroid cystic nodules all measuring 5 mm or less in size.  Normal thyroid echogenicity. No hypervascularity or regional adenopathy.  IMPRESSION: 1.4 cm right mid thyroid TR 4 nodule meets criteria for follow-up in 1 year.     Thyroid U/S (08/18/2020): Parenchymal Echotexture: Normal Isthmus: 0.3 cm Right lobe: 5 x 1.3 x 1.8 cm Left lobe: 4.2 x 1.4 x 1.7 cm ____________________________________________________________  Nodule # 1: Prior biopsy: No Location: Right; Mid Maximum size: 1.6 cm; Other 2 dimensions: 1 x 0.6 cm, previously, 1.4 x 0.9 x 0.4 cm Composition: solid/almost completely solid (2) Echogenicity: hypoechoic (2) Echogenic foci: macrocalcifications (1) **Given size (>/= 1.5 cm) and appearance, fine needle aspiration of this moderately suspicious nodule should be considered based on TI-RADS criteria. _________________________________________________________  IMPRESSION: Relatively stable previously demonstrated thyroid nodule in the right mid thyroid gland. While this thyroid nodule technically now meets criteria for  fine-needle aspiration, a 1 year follow-up ultrasound is likely appropriate given its minimal interval change in size and  appearance.    Reviewed her TFTs: 06/29/2018: TSH 1.170, free T4 0.99 09/07/2016: TSH 1.170, free T4 1.22 08/11/2008: TSH 1.6 No results found for: TSH, FREET4   Pt denies: - hoarseness - dysphagia - choking - SOB with lying down But mentions that she occasionally feels a little pressure in her neck when she swallows  + FH of thyroid ds.- MGM with hypothyroidism. No FH of thyroid cancer. No h/o radiation tx to head or neck.  No seaweed or kelp. No recent contrast studies. No herbal supplements. No Biotin use. No recent steroids use.   She is using essential oils.  On vitamin D and Elderberry Gummies.  She had a partial hysterectomy 01/2017, after a uterine ablation was unsuccessful for management of uterine fibroids. She has a history of a heart murmur, gestational diabetes  ROS: Constitutional: no weight gain/no weight loss, no fatigue, no subjective hyperthermia, no subjective hypothermia Eyes: no blurry vision, no xerophthalmia ENT: no sore throat, + see HPI Cardiovascular: no CP/no SOB/no palpitations/no leg swelling Respiratory: no cough/no SOB/no wheezing Gastrointestinal: no N/no V/no D/no C/no acid reflux Musculoskeletal: no muscle aches/no joint aches Skin: no rashes, no hair loss Neurological: no tremors/no numbness/no tingling/no dizziness  I reviewed pt's medications, allergies, PMH, social hx, family hx, and changes were documented in the history of present illness. Otherwise, unchanged from my initial visit note.  Past Medical History:  Diagnosis Date  . Gestational diabetes   . Heart murmur    mild, noted with pregnancy   Past Surgical History:  Procedure Laterality Date  . DILATATION & CURETTAGE/HYSTEROSCOPY WITH MYOSURE N/A 07/21/2016   Procedure: [place];  Surgeon: Servando Salina, MD;   Location: [facility];  Service: Gynecology;  Laterality: N/A;  . HERNIA REPAIR     childhood  . HYSTEROSCOPY N/A 07/21/2016   Procedure: HYSTEROSCOPY WITH HYDROTHERMAL ABLATION;  Surgeon: Servando Salina, MD;  Location: [facility];  Service: Gynecology;  Laterality: N/A;  . LAPAROSCOPIC VAGINAL HYSTERECTOMY WITH SALPINGECTOMY Bilateral 01/12/2017   Procedure: LAPAROSCOPIC ASSISTED VAGINAL HYSTERECTOMY WITH SALPINGECTOMY;  Surgeon: Servando Salina, MD;  Location: [facility];  Service: Gynecology;  Laterality: Bilateral;  . MYRINGOTOMY    . WISDOM TOOTH EXTRACTION     Social History   Socioeconomic History  . Marital status: Married    Spouse name: Not on file  . Number of children: 2  . Years of education: Not on file  . Highest education level: Not on file  Occupational History  .  Engineer, maintenance ([employer]), [employer] administrator  . Smoking status: Never Smoker  . Smokeless tobacco: Never Used  Substance and Sexual Activity  . Alcohol use: Yes    Comment: rare  . Drug use: No   She is not taking any prescription medications.  Allergies  Allergen Reactions  . Sulfa Antibiotics Rash   Family history: Diabetes mellitus in great grandmothers, great aunts and uncles HTN in grandfather, great aunt hyperlipidemia and grandmother Heart disease in grandfather and great-grandmother, heart murmur in mother and brother Lung and liver cancer in grandfather Thyroid disease in the grandmother  PE: BP 110/78   Pulse 81   Ht 5\' 4"  (1.626 m)   Wt 159 lb 6.4 oz (72.3 kg)   LMP 12/23/2016 (Exact Date)   SpO2 99%   BMI 27.36 kg/m  Wt Readings from Last 3 Encounters:  08/27/20 159 lb 6.4 oz (72.3 kg)  08/28/18 175 lb (79.4 kg)  01/12/17 170 lb (77.1 kg)   Constitutional: overweight,  in NAD Eyes: PERRLA, EOMI, no exophthalmos ENT: moist mucous membranes, no thyromegaly but left lobe palpable, no cervical lymphadenopathy Cardiovascular: RRR, No MRG Respiratory: CTA B Gastrointestinal: abdomen soft, NT,  ND, BS+ Musculoskeletal: no deformities, strength intact in all 4 Skin: moist, warm, no rashes Neurological: no tremor with outstretched hands, DTR normal in all 4  ASSESSMENT: 1. Thyroid nodule  2.  Goiter  PLAN: 1. Thyroid nodule -I reviewed the images of her thyroid ultrasounds from 2020-2022.  The dominant nodule has increased in size since 2020, it is hypoechoic and contains macrocalcifications.  It does not contain microcalcifications, internal blood flow, it does not have irregular margins or a taller than wide orientation.  Due to the increase in size, however, this nodule now meets criteria for biopsy.   -We discussed about FNA procedure and expected results: Benign, malignant, or inconclusive.  We also discussed about further plan depending on the FNA results. -Of note, patient does not have a high TSH, thyroid cancer family history, or personal history of radiation therapy to head or neck.  All these would favor benignity. -I did explain that if this turns out to be thyroid cancer, this is an indolent cancer, with good prognosis, but thyroidectomy would be indicated.  This will be followed by starting levothyroxine therapy. -At today's visit, we will check a TSH and will also order the thyroid FNA -If the results are benign, we will see her back in a year.  If not, I will refer her to surgery and see her sooner.  2.  Goiter -She denies neck compression symptoms -The thyroid size appears normal on ultrasound, with the left lobe slightly larger than the right.  The left lobe is palpable on exam. -We will continue to follow her clinically -We will check a TSH today  Component     Latest Ref Rng & Units 08/27/2020  TSH     0.35 - 4.50 uIU/mL 1.46  Normal TSH.  FNA (09/08/2020):  Clinical History: TI-RADS total points 5   FINAL MICROSCOPIC DIAGNOSIS:  - Atypia of undetermined significance (Bethesda category III)   SPECIMEN ADEQUACY:  Satisfactory for evaluation   DIAGNOSTIC  COMMENTS:  This specimen will be sent for Afirma testing.    Afirma: Suspicious  I received the Afirma results on 09/25/2020.  I called the patient explaining the results and probability of cancer.  Discussed about guidelines that recommend R hemithyroidectomy. Explained about the fact that 20 to 25% of patients will require levothyroxine after the surgery. She agrees with this plan.  I will refer her to Dr. Harlow Asa.  Philemon Kingdom, MD PhD [facility] Endocrinology

## 2020-09-08 ENCOUNTER — Ambulatory Visit
Admission: RE | Admit: 2020-09-08 | Discharge: 2020-09-08 | Disposition: A | Discharge status: Home / Self Care | Payer: BC Managed Care – PPO | Source: Ambulatory Visit | Attending: Internal Medicine | Admitting: Internal Medicine

## 2020-09-08 ENCOUNTER — Other Ambulatory Visit (HOSPITAL_COMMUNITY)
Admission: RE | Admit: 2020-09-08 | Discharge: 2020-09-08 | Disposition: A | Discharge status: Home / Self Care | Payer: BC Managed Care – PPO | Source: Ambulatory Visit | Attending: Radiology | Admitting: Radiology

## 2020-09-08 DIAGNOSIS — E041 Nontoxic single thyroid nodule: Secondary | ICD-10-CM | POA: Diagnosis not present

## 2020-09-08 DIAGNOSIS — R896 Abnormal cytological findings in specimens from other organs, systems and tissues: Secondary | ICD-10-CM | POA: Diagnosis not present

## 2020-09-10 LAB — CYTOLOGY - NON PAP

## 2020-09-14 DIAGNOSIS — E041 Nontoxic single thyroid nodule: Secondary | ICD-10-CM | POA: Diagnosis not present

## 2020-09-23 ENCOUNTER — Telehealth: Payer: Self-pay | Admitting: Internal Medicine

## 2020-09-23 NOTE — Telephone Encounter (Signed)
T, Can you please call Cytology and see if the Afirma test is back for her? I have not seen it yet. Ty! C

## 2020-09-23 NOTE — Telephone Encounter (Signed)
Pt called because she had a thyroid biopsy done on 3/8 and has not heard back regarding this yet and just wanted Dr. Cruzita Lederer to know.

## 2020-09-25 ENCOUNTER — Telehealth: Payer: Self-pay | Admitting: Internal Medicine

## 2020-09-25 NOTE — Addendum Note (Signed)
Addended by: Philemon Kingdom on: 09/25/2020 06:22 PM   Modules accepted: Orders

## 2020-09-25 NOTE — Telephone Encounter (Signed)
Spoke with Afirma report to be faxed today.

## 2020-09-25 NOTE — Telephone Encounter (Signed)
T, thank you so much!

## 2020-09-25 NOTE — Telephone Encounter (Signed)
ERROR

## 2020-09-29 ENCOUNTER — Encounter (HOSPITAL_COMMUNITY): Payer: Self-pay

## 2020-09-29 NOTE — Progress Notes (Signed)
Dear Sherren Mocha, Thank you! I can say the same - all my patients love you, too! :) It is usually a difficult decision to stop taking new patients when you can always "squeeze" more in, but I feel I could take better care of my existing patients if my schedule were not that full.  However, this will most likely be a provisional measure. Kelly Osborne

## 2020-10-15 ENCOUNTER — Ambulatory Visit: Payer: Self-pay | Admitting: Surgery

## 2020-10-15 DIAGNOSIS — D440 Neoplasm of uncertain behavior of thyroid gland: Secondary | ICD-10-CM | POA: Diagnosis not present

## 2020-10-15 DIAGNOSIS — E041 Nontoxic single thyroid nodule: Secondary | ICD-10-CM | POA: Diagnosis not present

## 2020-10-28 ENCOUNTER — Other Ambulatory Visit: Payer: Self-pay

## 2020-10-28 ENCOUNTER — Encounter (HOSPITAL_COMMUNITY): Payer: Self-pay

## 2020-10-28 DIAGNOSIS — Z01818 Encounter for other preprocedural examination: Secondary | ICD-10-CM | POA: Diagnosis not present

## 2020-10-28 NOTE — Progress Notes (Signed)
Any medical and surgical hx along with psychosocial and education and idscharge planning done on this pt by Gillian Shields RN was done in error on 10/28/20.

## 2020-10-29 NOTE — Progress Notes (Signed)
DUE TO COVID-19 ONLY ONE VISITOR IS ALLOWED TO COME WITH YOU AND STAY IN THE WAITING ROOM ONLY DURING PRE OP AND PROCEDURE DAY OF SURGERY. THE 1 VISITOR  MAY VISIT WITH YOU AFTER SURGERY IN YOUR PRIVATE ROOM DURING VISITING HOURS ONLY!  YOU NEED TO HAVE A COVID 19 TEST ON__5/5/22 _____ @___0845  am ____, THIS TEST MUST BE DONE BEFORE SURGERY,  COVID TESTING SITE [address] [place] [address], IT IS ON THE RIGHT GOING OUT [address] APPROXIMATELY  2 MINUTES PAST [employer] ON THE RIGHT. ONCE YOUR COVID TEST IS COMPLETED,  PLEASE BEGIN THE QUARANTINE INSTRUCTIONS AS OUTLINED IN YOUR HANDOUT.                AMBERLIE GAILLARD  10/29/2020   Your procedure is scheduled on:  11/09/20   Report to [facility] Main  Entrance   Report to admitting at    [place] AM     Call this number if you have problems the morning of surgery [phone]    REMEMBER: NO  SOLID FOOD CANDY OR GUM AFTER MIDNIGHT. CLEAR LIQUIDS UNTIL       0430am       . NOTHING BY MOUTH EXCEPT CLEAR LIQUIDS UNTIL     0430am   . PLEASE FINISH ENSURE DRINK PER SURGEON ORDER  WHICH NEEDS TO BE COMPLETED AT  0430am   .      CLEAR LIQUID DIET   Foods Allowed                                                                    Coffee and tea, regular and decaf                            Fruit ices (not with fruit pulp)                                      Iced Popsicles                                    Carbonated beverages, regular and diet                                    Cranberry, grape and apple juices Sports drinks like Gatorade Lightly seasoned clear broth or consume(fat free) Sugar, honey syrup ___________________________________________________________________      BRUSH YOUR TEETH MORNING OF SURGERY AND RINSE YOUR MOUTH OUT, NO CHEWING GUM CANDY OR MINTS.     Take these medicines the morning of surgery with A SIP OF WATER: none   DO NOT TAKE ANY DIABETIC MEDICATIONS DAY OF YOUR  SURGERY                               You may not have any metal on your body including hair pins and  piercings  Do not wear jewelry, make-up, lotions, powders or perfumes, deodorant             Do not wear nail polish on your fingernails.  Do not shave  48 hours prior to surgery.              Men may shave face and neck.   Do not bring valuables to the hospital. [place].  Contacts, dentures or bridgework may not be worn into surgery.  Leave suitcase in the car. After surgery it may be brought to your room.     Patients discharged the day of surgery will not be allowed to drive home. IF YOU ARE HAVING SURGERY AND GOING HOME THE SAME DAY, YOU MUST HAVE AN ADULT TO DRIVE YOU HOME AND BE WITH YOU FOR 24 HOURS. YOU MAY GO HOME BY TAXI OR UBER OR ORTHERWISE, BUT AN ADULT MUST ACCOMPANY YOU HOME AND STAY WITH YOU FOR 24 HOURS.  Name and phone number of your driver:  Special Instructions: N/A              Please read over the following fact sheets you were given: _____________________________________________________________________  [facility] - Preparing for Surgery Before surgery, you can play an important role.  Because skin is not sterile, your skin needs to be as free of germs as possible.  You can reduce the number of germs on your skin by washing with CHG (chlorahexidine gluconate) soap before surgery.  CHG is an antiseptic cleaner which kills germs and bonds with the skin to continue killing germs even after washing. Please DO NOT use if you have an allergy to CHG or antibacterial soaps.  If your skin becomes reddened/irritated stop using the CHG and inform your nurse when you arrive at Short Stay. Do not shave (including legs and underarms) for at least 48 hours prior to the first CHG shower.  You may shave your face/neck. Please follow these instructions carefully:  1.  Shower with CHG Soap the night before surgery and the   morning of Surgery.  2.  If you choose to wash your hair, wash your hair first as usual with your  normal  shampoo.  3.  After you shampoo, rinse your hair and body thoroughly to remove the  shampoo.                           4.  Use CHG as you would any other liquid soap.  You can apply chg directly  to the skin and wash                       Gently with a scrungie or clean washcloth.  5.  Apply the CHG Soap to your body ONLY FROM THE NECK DOWN.   Do not use on face/ open                           Wound or open sores. Avoid contact with eyes, ears mouth and genitals (private parts).                       Wash face,  Genitals (private parts) with your normal soap.             6.  Wash thoroughly, paying special attention to the area where your surgery  will be performed.  7.  Thoroughly rinse your body with warm water from the neck down.  8.  DO NOT shower/wash with your normal soap after using and rinsing off  the CHG Soap.                9.  Pat yourself dry with a clean towel.            10.  Wear clean pajamas.            11.  Place clean sheets on your bed the night of your first shower and do not  sleep with pets. Day of Surgery : Do not apply any lotions/deodorants the morning of surgery.  Please wear clean clothes to the hospital/surgery center.  FAILURE TO FOLLOW THESE INSTRUCTIONS MAY RESULT IN THE CANCELLATION OF YOUR SURGERY PATIENT SIGNATURE_________________________________  NURSE SIGNATURE__________________________________  ________________________________________________________________________

## 2020-10-30 ENCOUNTER — Encounter (HOSPITAL_COMMUNITY): Payer: Self-pay

## 2020-10-30 ENCOUNTER — Ambulatory Visit (HOSPITAL_COMMUNITY)
Admission: RE | Admit: 2020-10-30 | Discharge: 2020-10-30 | Disposition: A | Discharge status: Home / Self Care | Payer: BC Managed Care – PPO | Source: Ambulatory Visit | Attending: Anesthesiology | Admitting: Anesthesiology

## 2020-10-30 ENCOUNTER — Encounter (HOSPITAL_COMMUNITY)
Admission: RE | Admit: 2020-10-30 | Discharge: 2020-10-30 | Disposition: A | Discharge status: Home / Self Care | Payer: BC Managed Care – PPO | Source: Ambulatory Visit | Attending: Surgery | Admitting: Surgery

## 2020-10-30 ENCOUNTER — Other Ambulatory Visit: Payer: Self-pay

## 2020-10-30 DIAGNOSIS — Z01818 Encounter for other preprocedural examination: Secondary | ICD-10-CM | POA: Diagnosis not present

## 2020-10-30 HISTORY — DX: Sleep apnea, unspecified: G47.30

## 2020-10-30 LAB — CBC
HCT: 41.9 % (ref 36.0–46.0)
Hemoglobin: 13.6 g/dL (ref 12.0–15.0)
MCH: 30.2 pg (ref 26.0–34.0)
MCHC: 32.5 g/dL (ref 30.0–36.0)
MCV: 93.1 fL (ref 80.0–100.0)
Platelets: 323 10*3/uL (ref 150–400)
RBC: 4.5 MIL/uL (ref 3.87–5.11)
RDW: 12.9 % (ref 11.5–15.5)
WBC: 6.4 10*3/uL (ref 4.0–10.5)
nRBC: 0 % (ref 0.0–0.2)

## 2020-10-30 NOTE — Progress Notes (Addendum)
        Anesthesia Review:  PCP: none  Cardiologist : Chest x-ray : Final CXR done 10/30/20-epic  EKG : Echo : Stress test: Cardiac Cath :  Activity level: can do a flight of stairs without difficulty  Sleep Study/ CPAP : Fasting Blood Sugar :      / Checks Blood Sugar -- times a day:   Blood Thinner/ Instructions /Last Dose: ASA / Instructions/ Last Dose :

## 2020-10-31 ENCOUNTER — Encounter (HOSPITAL_COMMUNITY): Payer: Self-pay | Admitting: Surgery

## 2020-10-31 DIAGNOSIS — D440 Neoplasm of uncertain behavior of thyroid gland: Secondary | ICD-10-CM | POA: Diagnosis present

## 2020-10-31 NOTE — H&P (Signed)
[facility]  Kelly Osborne DOB: 07-07-1975 Married / Language: English / Race: White Female   History of Present Illness   The patient is a 45 year old female who presents with a thyroid nodule.  CHIEF COMPLAINT: right thyroid neoplasm of uncertain behavior  Patient is referred by Dr. Philemon Kingdom for surgical evaluation and management of a right thyroid neoplasm of uncertain behavior. Patient's primary care provider will be Laurin Coder, NP. Patient was diagnosed approximately 3 years ago on physical examination with a thyroid nodule. She underwent a sequence of ultrasound examinations. Her most recent examination in February 2022 MM straight and a 1.6 cm nodule in the mid right thyroid lobe which was solid, hypoechoic, and contained macrocalcifications. Fine-needle aspiration biopsy was recommended and performed on September 08, 2020. This showed atypia of undetermined significance, Bethesda category III. The sample was sent for molecular genetic testing, AFIRMA, and returned with the result of suspicious, rendering a risk of malignancy of approximately 50%. The patient has had no prior head or neck surgery. She has never been on thyroid medication. Her TSH level is normal at 1.46. There is no family history of thyroid cancer or other endocrine neoplasm. Patient presents today accompanied by her husband. 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.   Past Surgical History  Hysterectomy (not due to cancer) - Partial  Open Inguinal Hernia Surgery  Bilateral. Oral Surgery   Diagnostic Studies History Colonoscopy  never Mammogram  1-3 years ago Pap Smear  1-5 years ago  Allergies Sulfa 10 *OPHTHALMIC AGENTS*  Allergies Reconciled   Medication History  No Current Medications Medications Reconciled  Social History  Alcohol use  Occasional alcohol use. Caffeine use  Carbonated beverages, Tea. No drug use  Tobacco use  Never  smoker.  Family History  Prostate Cancer  Father.  Pregnancy / Birth History  Age at menarche  54 years. Gravida  2 Maternal age  74-25 Para  2  Other Problems  Diabetes Mellitus  Heart murmur  Inguinal Hernia  Thyroid Disease   Review of Systems General Not Present- Appetite Loss, Chills, Fatigue, Fever, Night Sweats, Weight Gain and Weight Loss. HEENT Not Present- Earache, Hearing Loss, Hoarseness, Nose Bleed, Oral Ulcers, Ringing in the Ears, Seasonal Allergies, Sinus Pain, Sore Throat, Visual Disturbances, Wears glasses/contact lenses and Yellow Eyes. Respiratory Not Present- Bloody sputum, Chronic Cough, Difficulty Breathing, Snoring and Wheezing. Breast Not Present- Breast Mass, Breast Pain, Nipple Discharge and Skin Changes. Cardiovascular Not Present- Chest Pain, Difficulty Breathing Lying Down, Leg Cramps, Palpitations, Rapid Heart Rate, Shortness of Breath and Swelling of Extremities. Gastrointestinal Not Present- Abdominal Pain, Bloating, Bloody Stool, Change in Bowel Habits, Chronic diarrhea, Constipation, Difficulty Swallowing, Excessive gas, Gets full quickly at meals, Hemorrhoids, Indigestion, Nausea, Rectal Pain and Vomiting. Female Genitourinary Not Present- Frequency, Nocturia, Painful Urination, Pelvic Pain and Urgency. Musculoskeletal Not Present- Back Pain, Joint Pain, Joint Stiffness, Muscle Pain, Muscle Weakness and Swelling of Extremities. Neurological Not Present- Decreased Memory, Fainting, Headaches, Numbness, Seizures, Tingling, Tremor, Trouble walking and Weakness. Psychiatric Not Present- Anxiety, Bipolar, Change in Sleep Pattern, Depression, Fearful and Frequent crying. Endocrine Not Present- Cold Intolerance, Excessive Hunger, Hair Changes, Heat Intolerance, Hot flashes and New Diabetes.  Vitals Weight: 159 lb Height: 64in Body Surface Area: 1.77 m Body Mass Index: 27.29 kg/m  Temp.: 98.42F  Pulse: 114 (Regular)  P.OX: 98% (Room  air) BP: 110/70(Sitting, Left Arm, Standard)   Physical Exam  GENERAL APPEARANCE Development: normal  Nutritional status: normal Gross deformities: none  SKIN Rash, lesions, ulcers: none Induration, erythema: none Nodules: none palpable  EYES Conjunctiva and lids: normal Pupils: equal and reactive Iris: normal bilaterally  EARS, NOSE, MOUTH, THROAT External ears: no lesion or deformity External nose: no lesion or deformity Hearing: grossly normal Due to Covid-19 pandemic, patient is wearing a mask.  NECK Symmetric: yes Trachea: midline Thyroid: On palpation, there is a subtle soft nodularity in the mid right thyroid lobe which is smooth and mobile and nontender. Left lobe is without palpable abnormality. There is no palpable lymphadenopathy in the neck.  CHEST Respiratory effort: normal Retraction or accessory muscle use: no Breath sounds: normal bilaterally Rales, rhonchi, wheeze: none  CARDIOVASCULAR Auscultation: regular rhythm, normal rate Murmurs: none Pulses: radial pulse 2+ palpable Lower extremity edema: none  MUSCULOSKELETAL Station and gait: normal Digits and nails: no clubbing or cyanosis Muscle strength: grossly normal all extremities Range of motion: grossly normal all extremities Deformity: none  LYMPHATIC Cervical: none palpable Supraclavicular: none palpable  PSYCHIATRIC Oriented to person, place, and time: yes Mood and affect: normal for situation Judgment and insight: appropriate for situation    Assessment & Plan   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  Patient provided with a copy of "The Thyroid Book: Medical and Surgical Treatment of Thyroid Problems", published by [employer], 16 pages. Book reviewed and explained to patient during  visit today.  Patient has a 1.6 cm tumor in the right thyroid lobe. After fine-needle aspiration biopsy and molecular genetic testing, there is approximately a 50% risk of malignancy. I have recommended proceeding with right thyroid lobectomy for definitive diagnosis and management. We discussed the pros and cons of lobectomy versus total thyroidectomy. We discussed the risk and benefits of the procedure including the risk of recurrent laryngeal nerve injury and injury to parathyroid glands. We discussed the size and location of the surgical incision. We discussed the hospital stay to be anticipated. We discussed the postoperative recovery. We discussed the potential need for additional surgery. We discussed the potential need for radioactive iodine treatment. We discussed the potential need for lifelong thyroid hormone replacement. The patient and her husband understand and wish to proceed with surgery in the near future.  The risks and benefits of the procedure have been discussed at length with the patient. The patient understands the proposed procedure, potential alternative treatments, and the course of recovery to be expected. All of the patient's questions have been answered at this time. The patient wishes to proceed with surgery.  Armandina Gemma, MD [facility] Office: [phone]

## 2020-11-02 ENCOUNTER — Other Ambulatory Visit (HOSPITAL_COMMUNITY): Payer: BC Managed Care – PPO

## 2020-11-05 ENCOUNTER — Other Ambulatory Visit (HOSPITAL_COMMUNITY)
Admission: RE | Admit: 2020-11-05 | Discharge: 2020-11-05 | Disposition: A | Discharge status: Home / Self Care | Payer: BC Managed Care – PPO | Source: Ambulatory Visit | Attending: Surgery | Admitting: Surgery

## 2020-11-05 DIAGNOSIS — Z01812 Encounter for preprocedural laboratory examination: Secondary | ICD-10-CM | POA: Diagnosis not present

## 2020-11-05 DIAGNOSIS — Z20822 Contact with and (suspected) exposure to covid-19: Secondary | ICD-10-CM | POA: Diagnosis not present

## 2020-11-06 LAB — SARS CORONAVIRUS 2 (TAT 6-24 HRS): SARS Coronavirus 2: NEGATIVE

## 2020-11-08 ENCOUNTER — Encounter (HOSPITAL_COMMUNITY): Payer: Self-pay | Admitting: Surgery

## 2020-11-08 NOTE — Anesthesia Preprocedure Evaluation (Addendum)
Anesthesia Evaluation  Patient identified by MRN, date of birth, ID band Patient awake    Reviewed: Allergy & Precautions, NPO status , Patient's Chart, lab work & pertinent test results  Airway Mallampati: II  TM Distance: >3 FB Neck ROM: Full    Dental  (+) Dental Advisory Given   Pulmonary neg pulmonary ROS,    breath sounds clear to auscultation       Cardiovascular negative cardio ROS   Rhythm:Regular Rate:Normal     Neuro/Psych negative neurological ROS     GI/Hepatic negative GI ROS, Neg liver ROS,   Endo/Other  Thyroid nodule  Renal/GU negative Renal ROS     Musculoskeletal   Abdominal   Peds  Hematology negative hematology ROS (+)   Anesthesia Other Findings   Reproductive/Obstetrics                             Lab Results  Component Value Date   WBC 6.4 10/30/2020   HGB 13.6 10/30/2020   HCT 41.9 10/30/2020   MCV 93.1 10/30/2020   PLT 323 10/30/2020   Lab Results  Component Value Date   CREATININE 0.91 01/13/2017   BUN 7 01/13/2017   NA 139 01/13/2017   K 3.7 01/13/2017   CL 107 01/13/2017   CO2 26 01/13/2017    Anesthesia Physical Anesthesia Plan  ASA: II  Anesthesia Plan: General   Post-op Pain Management:    Induction:   PONV Risk Score and Plan: 3 and Midazolam, Dexamethasone, Ondansetron and Treatment may vary due to age or medical condition  Airway Management Planned: Oral ETT  Additional Equipment: None  Intra-op Plan:   Post-operative Plan: Extubation in OR  Informed Consent: I have reviewed the patients History and Physical, chart, labs and discussed the procedure including the risks, benefits and alternatives for the proposed anesthesia with the patient or authorized representative who has indicated his/her understanding and acceptance.     Dental advisory given  Plan Discussed with: CRNA  Anesthesia Plan Comments:         Anesthesia Quick Evaluation

## 2020-11-09 ENCOUNTER — Ambulatory Visit (HOSPITAL_COMMUNITY)
Admission: RE | Admit: 2020-11-09 | Discharge: 2020-11-10 | Disposition: A | Discharge status: Home / Self Care | Payer: BC Managed Care – PPO | Attending: Surgery | Admitting: Surgery

## 2020-11-09 ENCOUNTER — Ambulatory Visit (HOSPITAL_COMMUNITY): Payer: BC Managed Care – PPO | Admitting: Certified Registered"

## 2020-11-09 ENCOUNTER — Other Ambulatory Visit: Payer: Self-pay

## 2020-11-09 ENCOUNTER — Encounter (HOSPITAL_COMMUNITY)
Admission: RE | Disposition: A | Discharge status: Home / Self Care | Payer: Self-pay | Source: Home / Self Care | Attending: Surgery

## 2020-11-09 ENCOUNTER — Encounter (HOSPITAL_COMMUNITY): Payer: Self-pay | Admitting: Surgery

## 2020-11-09 DIAGNOSIS — Z8042 Family history of malignant neoplasm of prostate: Secondary | ICD-10-CM | POA: Insufficient documentation

## 2020-11-09 DIAGNOSIS — E049 Nontoxic goiter, unspecified: Secondary | ICD-10-CM | POA: Diagnosis not present

## 2020-11-09 DIAGNOSIS — Z882 Allergy status to sulfonamides status: Secondary | ICD-10-CM | POA: Diagnosis not present

## 2020-11-09 DIAGNOSIS — C73 Malignant neoplasm of thyroid gland: Secondary | ICD-10-CM | POA: Insufficient documentation

## 2020-11-09 DIAGNOSIS — Z90711 Acquired absence of uterus with remaining cervical stump: Secondary | ICD-10-CM | POA: Insufficient documentation

## 2020-11-09 DIAGNOSIS — D440 Neoplasm of uncertain behavior of thyroid gland: Secondary | ICD-10-CM | POA: Diagnosis not present

## 2020-11-09 DIAGNOSIS — E041 Nontoxic single thyroid nodule: Secondary | ICD-10-CM | POA: Diagnosis present

## 2020-11-09 DIAGNOSIS — Z8719 Personal history of other diseases of the digestive system: Secondary | ICD-10-CM | POA: Insufficient documentation

## 2020-11-09 DIAGNOSIS — E119 Type 2 diabetes mellitus without complications: Secondary | ICD-10-CM | POA: Insufficient documentation

## 2020-11-09 HISTORY — PX: THYROID LOBECTOMY: SHX420

## 2020-11-09 SURGERY — LOBECTOMY, THYROID
Anesthesia: General | Site: Neck | Laterality: Right

## 2020-11-09 MED ORDER — MIDAZOLAM HCL 2 MG/2ML IJ SOLN
INTRAMUSCULAR | Status: AC
  Filled 2020-11-09: qty 2

## 2020-11-09 MED ORDER — MIDAZOLAM HCL 2 MG/2ML IJ SOLN
INTRAMUSCULAR | Status: DC | PRN
  Administered 2020-11-09: 2 mg via INTRAVENOUS

## 2020-11-09 MED ORDER — SODIUM CHLORIDE 0.45 % IV SOLN: INTRAVENOUS | Status: DC

## 2020-11-09 MED ORDER — CEFAZOLIN SODIUM-DEXTROSE 2-4 GM/100ML-% IV SOLN
2.0000 g | INTRAVENOUS | Status: AC
  Administered 2020-11-09: 2 g via INTRAVENOUS
  Filled 2020-11-09: qty 100

## 2020-11-09 MED ORDER — LACTATED RINGERS IV SOLN: INTRAVENOUS | Status: DC

## 2020-11-09 MED ORDER — LIDOCAINE 20MG/ML (2%) 15 ML SYRINGE OPTIME
INTRAMUSCULAR | Status: DC | PRN
  Administered 2020-11-09: 1.5 mg/kg/h via INTRAVENOUS

## 2020-11-09 MED ORDER — CHLORHEXIDINE GLUCONATE CLOTH 2 % EX PADS: 6.0000 | MEDICATED_PAD | Freq: Once | CUTANEOUS | Status: DC

## 2020-11-09 MED ORDER — OXYCODONE HCL 5 MG PO TABS
5.0000 mg | ORAL_TABLET | ORAL | Status: DC | PRN
  Administered 2020-11-09: 5 mg via ORAL
  Filled 2020-11-09: qty 1

## 2020-11-09 MED ORDER — PROPOFOL 10 MG/ML IV BOLUS
INTRAVENOUS | Status: DC | PRN
  Administered 2020-11-09: 200 mg via INTRAVENOUS

## 2020-11-09 MED ORDER — ONDANSETRON HCL 4 MG/2ML IJ SOLN
INTRAMUSCULAR | Status: DC | PRN
  Administered 2020-11-09: 4 mg via INTRAVENOUS

## 2020-11-09 MED ORDER — ONDANSETRON 4 MG PO TBDP: 4.0000 mg | ORAL_TABLET | Freq: Four times a day (QID) | ORAL | Status: DC | PRN

## 2020-11-09 MED ORDER — FENTANYL CITRATE (PF) 250 MCG/5ML IJ SOLN
INTRAMUSCULAR | Status: DC | PRN
  Administered 2020-11-09 (×4): 50 ug via INTRAVENOUS

## 2020-11-09 MED ORDER — SUGAMMADEX SODIUM 200 MG/2ML IV SOLN
INTRAVENOUS | Status: DC | PRN
  Administered 2020-11-09: 150 mg via INTRAVENOUS

## 2020-11-09 MED ORDER — CHLORHEXIDINE GLUCONATE 0.12 % MT SOLN
15.0000 mL | Freq: Once | OROMUCOSAL | Status: AC
  Administered 2020-11-09: 15 mL via OROMUCOSAL

## 2020-11-09 MED ORDER — PROMETHAZINE HCL 25 MG/ML IJ SOLN: 6.2500 mg | INTRAMUSCULAR | Status: DC | PRN

## 2020-11-09 MED ORDER — LIDOCAINE 2% (20 MG/ML) 5 ML SYRINGE
INTRAMUSCULAR | Status: AC
  Filled 2020-11-09: qty 5

## 2020-11-09 MED ORDER — ACETAMINOPHEN 650 MG RE SUPP: 650.0000 mg | Freq: Four times a day (QID) | RECTAL | Status: DC | PRN

## 2020-11-09 MED ORDER — ROCURONIUM BROMIDE 10 MG/ML (PF) SYRINGE
PREFILLED_SYRINGE | INTRAVENOUS | Status: DC | PRN
  Administered 2020-11-09: 40 mg via INTRAVENOUS

## 2020-11-09 MED ORDER — DEXAMETHASONE SODIUM PHOSPHATE 10 MG/ML IJ SOLN
INTRAMUSCULAR | Status: AC
  Filled 2020-11-09: qty 1

## 2020-11-09 MED ORDER — HYDROMORPHONE HCL 1 MG/ML IJ SOLN
1.0000 mg | INTRAMUSCULAR | Status: DC | PRN
Start: 2020-11-09 — End: 2020-11-10

## 2020-11-09 MED ORDER — FENTANYL CITRATE (PF) 100 MCG/2ML IJ SOLN: 25.0000 ug | INTRAMUSCULAR | Status: DC | PRN

## 2020-11-09 MED ORDER — ROCURONIUM BROMIDE 10 MG/ML (PF) SYRINGE
PREFILLED_SYRINGE | INTRAVENOUS | Status: AC
  Filled 2020-11-09: qty 10

## 2020-11-09 MED ORDER — GABAPENTIN 300 MG PO CAPS
300.0000 mg | ORAL_CAPSULE | Freq: Once | ORAL | Status: AC
  Administered 2020-11-09: 300 mg via ORAL
  Filled 2020-11-09: qty 1

## 2020-11-09 MED ORDER — FENTANYL CITRATE (PF) 100 MCG/2ML IJ SOLN
INTRAMUSCULAR | Status: AC
  Filled 2020-11-09: qty 2

## 2020-11-09 MED ORDER — ONDANSETRON HCL 4 MG/2ML IJ SOLN
INTRAMUSCULAR | Status: AC
  Filled 2020-11-09: qty 2

## 2020-11-09 MED ORDER — LIDOCAINE 2% (20 MG/ML) 5 ML SYRINGE
INTRAMUSCULAR | Status: DC | PRN
  Administered 2020-11-09: 60 mg via INTRAVENOUS

## 2020-11-09 MED ORDER — ACETAMINOPHEN 325 MG PO TABS
650.0000 mg | ORAL_TABLET | Freq: Four times a day (QID) | ORAL | Status: DC | PRN
Start: 2020-11-09 — End: 2020-11-10
  Administered 2020-11-09: 650 mg via ORAL
  Filled 2020-11-09: qty 2

## 2020-11-09 MED ORDER — ACETAMINOPHEN 500 MG PO TABS
1000.0000 mg | ORAL_TABLET | Freq: Once | ORAL | Status: AC
  Administered 2020-11-09: 1000 mg via ORAL
  Filled 2020-11-09: qty 2

## 2020-11-09 MED ORDER — ONDANSETRON HCL 4 MG/2ML IJ SOLN: 4.0000 mg | Freq: Four times a day (QID) | INTRAMUSCULAR | Status: DC | PRN

## 2020-11-09 MED ORDER — TRAMADOL HCL 50 MG PO TABS: 50.0000 mg | ORAL_TABLET | Freq: Four times a day (QID) | ORAL | Status: DC | PRN

## 2020-11-09 MED ORDER — ORAL CARE MOUTH RINSE: 15.0000 mL | Freq: Once | OROMUCOSAL | Status: AC

## 2020-11-09 MED ORDER — DEXAMETHASONE SODIUM PHOSPHATE 10 MG/ML IJ SOLN
INTRAMUSCULAR | Status: DC | PRN
  Administered 2020-11-09: 4 mg via INTRAVENOUS

## 2020-11-09 MED ORDER — 0.9 % SODIUM CHLORIDE (POUR BTL) OPTIME
TOPICAL | Status: DC | PRN
  Administered 2020-11-09: 1000 mL

## 2020-11-09 MED ORDER — PROPOFOL 10 MG/ML IV BOLUS
INTRAVENOUS | Status: AC
  Filled 2020-11-09: qty 40

## 2020-11-09 SURGICAL SUPPLY — 29 items
ATTRACTOMAT 16X20 MAGNETIC DRP (DRAPES) ×2 IMPLANT
BLADE SURG 15 STRL LF DISP TIS (BLADE) ×1 IMPLANT
BLADE SURG 15 STRL SS (BLADE) ×2
CHLORAPREP W/TINT 26 (MISCELLANEOUS) ×2 IMPLANT
CLIP VESOCCLUDE MED 6/CT (CLIP) ×6 IMPLANT
CLIP VESOCCLUDE SM WIDE 6/CT (CLIP) ×4 IMPLANT
COVER SURGICAL LIGHT HANDLE (MISCELLANEOUS) ×2 IMPLANT
COVER WAND RF STERILE (DRAPES) ×2 IMPLANT
DERMABOND ADVANCED (GAUZE/BANDAGES/DRESSINGS) ×1
DERMABOND ADVANCED .7 DNX12 (GAUZE/BANDAGES/DRESSINGS) ×1 IMPLANT
DRAPE LAPAROTOMY T 98X78 PEDS (DRAPES) ×2 IMPLANT
DRAPE UTILITY XL STRL (DRAPES) ×2 IMPLANT
ELECT PENCIL ROCKER SW 15FT (MISCELLANEOUS) ×2 IMPLANT
ELECT REM PT RETURN 15FT ADLT (MISCELLANEOUS) ×2 IMPLANT
GAUZE 4X4 16PLY RFD (DISPOSABLE) ×2 IMPLANT
GLOVE SURG ORTHO LTX SZ8 (GLOVE) ×2 IMPLANT
GOWN STRL REUS W/TWL XL LVL3 (GOWN DISPOSABLE) ×4 IMPLANT
HEMOSTAT SURGICEL 2X4 FIBR (HEMOSTASIS) ×2 IMPLANT
ILLUMINATOR WAVEGUIDE N/F (MISCELLANEOUS) ×2 IMPLANT
KIT BASIN OR (CUSTOM PROCEDURE TRAY) ×2 IMPLANT
KIT TURNOVER KIT A (KITS) ×2 IMPLANT
PACK BASIC VI WITH GOWN DISP (CUSTOM PROCEDURE TRAY) ×2 IMPLANT
SHEARS HARMONIC 9CM CVD (BLADE) ×2 IMPLANT
SUT MNCRL AB 4-0 PS2 18 (SUTURE) ×2 IMPLANT
SUT VIC AB 3-0 SH 18 (SUTURE) ×4 IMPLANT
SYR BULB IRRIG 60ML STRL (SYRINGE) ×2 IMPLANT
TOWEL OR 17X26 10 PK STRL BLUE (TOWEL DISPOSABLE) ×2 IMPLANT
TOWEL OR NON WOVEN STRL DISP B (DISPOSABLE) ×2 IMPLANT
TUBING CONNECTING 10 (TUBING) ×2 IMPLANT

## 2020-11-09 NOTE — Op Note (Signed)
Procedure Note  Pre-operative Diagnosis:  Right thyroid nodule, thyroid neoplasm of uncertain behavior  Post-operative Diagnosis:  same  Surgeon:  Armandina Gemma, MD  Assistant:  none   Procedure:  Right thyroid lobectomy  Anesthesia:  General  Estimated Blood Loss:  minimal  Drains: none         Specimen: thyroid lobe to pathology  Indications:  Patient is referred by Dr. Philemon Kingdom for surgical evaluation and management of a right thyroid neoplasm of uncertain behavior. Patient's primary care provider will be Laurin Coder, NP. Patient was diagnosed approximately 3 years ago on physical examination with a thyroid nodule. She underwent a sequence of ultrasound examinations. Her most recent examination in February 2022 MM straight and a 1.6 cm nodule in the mid right thyroid lobe which was solid, hypoechoic, and contained macrocalcifications. Fine-needle aspiration biopsy was recommended and performed on September 08, 2020. This showed atypia of undetermined significance, Bethesda category III. The sample was sent for molecular genetic testing, AFIRMA, and returned with the result of suspicious, rendering a risk of malignancy of approximately 50%. The patient now comes to surgery for right thyroid lobectomy.  Procedure Details: Procedure was done in OR #4 at the [facility]. The patient was brought to the operating room and placed in a supine position on the operating room table. Following administration of general anesthesia, the patient was positioned and then prepped and draped in the usual aseptic fashion. After ascertaining that an adequate level of anesthesia had been achieved, a small Kocher incision was made with #15 blade. Dissection was carried through subcutaneous tissues and platysma. Hemostasis was achieved with the electrocautery. Skin flaps were elevated cephalad and caudad from the thyroid notch to the sternal notch. A self-retaining retractor was placed for exposure.  Strap muscles were incised in the midline and dissection was begun on the right side. Strap muscles were reflected laterally. The right thyroid lobe was normal in size. The lobe was gently mobilized with blunt dissection. Superior pole vessels were dissected out and divided individually between small and medium ligaclips with the harmonic scalpel. The thyroid lobe was rolled anteriorly. Branches of the inferior thyroid artery were divided between small ligaclips with the harmonic scalpel. Inferior venous tributaries were divided between ligaclips. Both the superior and inferior parathyroid glands were identified and preserved on their vascular pedicles. The recurrent laryngeal nerve was identified and preserved along its course. The ligament of Gwenlyn Found was released with the electrocautery and the gland was mobilized onto the anterior trachea. Isthmus was mobilized across the midline. There was a small pyramidal lobe present which was resected with the isthmus. The thyroid parenchyma was transected at the junction of the isthmus and contralateral thyroid lobe with the harmonic scalpel. The thyroid lobe and isthmus were submitted to pathology for review.  The neck was irrigated with warm saline. Fibrillar was placed throughout the operative field. Strap muscles were approximated in the midline with interrupted 3-0 Vicryl sutures. Platysma was closed with interrupted 3-0 Vicryl sutures. Skin was closed with a running 4-0 Monocryl subcuticular suture.  Wound was washed and dried and Dermabond was applied. The patient was awakened from anesthesia and brought to the recovery room. The patient tolerated the procedure well.   Armandina Gemma, MD [facility] Office: [phone]

## 2020-11-09 NOTE — Interval H&P Note (Signed)
History and Physical Interval Note:  11/09/2020 7:03 AM  Kelly Osborne  has presented today for surgery, with the diagnosis of THYROID NEOPLASM OF UNCERTAIN BEHAVIOR.  The various methods of treatment have been discussed with the patient and family. After consideration of risks, benefits and other options for treatment, the patient has consented to    Procedure(s): RIGHT THYROID LOBECTOMY (Right) as a surgical intervention.    The patient's history has been reviewed, patient examined, no change in status, stable for surgery.  I have reviewed the patient's chart and labs.  Questions were answered to the patient's satisfaction.    Armandina Gemma, MD [facility] Office: [place]

## 2020-11-09 NOTE — Anesthesia Procedure Notes (Signed)
Procedure Name: Intubation Date/Time: 11/09/2020 7:27 AM Performed by: Eben Burow, CRNA Pre-anesthesia Checklist: Patient identified, Emergency Drugs available, Suction available, Patient being monitored and Timeout performed Patient Re-evaluated:Patient Re-evaluated prior to induction Oxygen Delivery Method: Circle system utilized Preoxygenation: Pre-oxygenation with 100% oxygen Induction Type: IV induction Ventilation: Mask ventilation without difficulty Laryngoscope Size: Mac and 4 Grade View: Grade I Tube type: Oral Tube size: 7.0 mm Number of attempts: 1 Airway Equipment and Method: Stylet Placement Confirmation: ETT inserted through vocal cords under direct vision,  positive ETCO2 and breath sounds checked- equal and bilateral Secured at: 21 cm Tube secured with: Tape Dental Injury: Teeth and Oropharynx as per pre-operative assessment

## 2020-11-09 NOTE — Anesthesia Postprocedure Evaluation (Signed)
Anesthesia Post Note  Patient: Kelly Osborne  Procedure(s) Performed: RIGHT THYROID LOBECTOMY (Right Neck)     Patient location during evaluation: PACU Anesthesia Type: General Level of consciousness: awake and alert Pain management: pain level controlled Vital Signs Assessment: post-procedure vital signs reviewed and stable Respiratory status: spontaneous breathing, nonlabored ventilation, respiratory function stable and patient connected to nasal cannula oxygen Cardiovascular status: blood pressure returned to baseline and stable Postop Assessment: no apparent nausea or vomiting Anesthetic complications: no   No complications documented.  Last Vitals:  Vitals:   11/09/20 1100 11/09/20 1151  BP: 128/66 115/80  Pulse: 72 78  Resp: 18 18  Temp: 36.7 C 36.8 C  SpO2: 100% 97%    Last Pain:  Vitals:   11/09/20 1100  TempSrc: Oral  PainSc:                  Tiajuana Amass

## 2020-11-09 NOTE — Transfer of Care (Signed)
Immediate Anesthesia Transfer of Care Note  Patient: Kelly Osborne  Procedure(s) Performed: RIGHT THYROID LOBECTOMY (Right Neck)  Patient Location: PACU  Anesthesia Type:General  Level of Consciousness: awake, alert , oriented and patient cooperative  Airway & Oxygen Therapy: Patient Spontanous Breathing and Patient connected to face mask oxygen  Post-op Assessment: Report given to RN and Post -op Vital signs reviewed and stable  Post vital signs: Reviewed and stable  Last Vitals:  Vitals Value Taken Time  BP 133/80 11/09/20 0846  Temp    Pulse 76 11/09/20 0849  Resp 18 11/09/20 0849  SpO2 100 % 11/09/20 0849  Vitals shown include unvalidated device data.  Last Pain:  Vitals:   11/09/20 0541  TempSrc: Oral  PainSc: 0-No pain         Complications: No complications documented.

## 2020-11-10 ENCOUNTER — Encounter (HOSPITAL_COMMUNITY): Payer: Self-pay | Admitting: Surgery

## 2020-11-10 DIAGNOSIS — Z882 Allergy status to sulfonamides status: Secondary | ICD-10-CM | POA: Diagnosis not present

## 2020-11-10 DIAGNOSIS — Z90711 Acquired absence of uterus with remaining cervical stump: Secondary | ICD-10-CM | POA: Diagnosis not present

## 2020-11-10 DIAGNOSIS — D440 Neoplasm of uncertain behavior of thyroid gland: Secondary | ICD-10-CM | POA: Diagnosis not present

## 2020-11-10 DIAGNOSIS — C73 Malignant neoplasm of thyroid gland: Secondary | ICD-10-CM | POA: Diagnosis not present

## 2020-11-10 DIAGNOSIS — Z8719 Personal history of other diseases of the digestive system: Secondary | ICD-10-CM | POA: Diagnosis not present

## 2020-11-10 DIAGNOSIS — E119 Type 2 diabetes mellitus without complications: Secondary | ICD-10-CM | POA: Diagnosis not present

## 2020-11-10 DIAGNOSIS — Z8042 Family history of malignant neoplasm of prostate: Secondary | ICD-10-CM | POA: Diagnosis not present

## 2020-11-10 NOTE — Plan of Care (Signed)
Instructions were reviewed with patient. All questions were answered. Patient was transported to main entrance by wheelchair. ° °

## 2020-11-10 NOTE — Discharge Summary (Signed)
Physician Discharge Summary [facility]  Patient ID: Kelly Osborne MRN: [id number] DOB/AGE: 07-04-1976 45 y.o.  Admit date: 11/09/2020  Discharge date: 11/10/2020  Discharge Diagnoses:  Principal Problem:   Neoplasm of uncertain behavior of thyroid gland Active Problems:   Right thyroid nodule   Discharged Condition: good  Hospital Course: Patient was admitted for observation following thyroid surgery.  Post op course was uncomplicated.  Pain was well controlled.  Tolerated diet.  Patient was prepared for discharge home on POD#1.  Consults: None  Treatments: surgery: right thyroid lobectomy  Discharge Exam: Blood pressure 109/75, pulse 65, temperature 98.4 F (36.9 C), temperature source Oral, resp. rate 17, weight 72.1 kg, last menstrual period 12/23/2016, SpO2 98 %. HEENT - clear Neck - wound dry and intact; mild STS; voice normal   Disposition: Home  Discharge Instructions    Diet - low sodium heart healthy   Complete by: As directed    Discharge instructions   Complete by: As directed    [facility]  THYROID & PARATHYROID SURGERY:  POST-OP INSTRUCTIONS  Always review your discharge instruction sheet from the facility where your surgery was performed.  A prescription for pain medication may be given to you upon discharge.  Take your pain medication as prescribed.  If narcotic pain medicine is not needed, then you may take acetaminophen (Tylenol) or ibuprofen (Advil) as needed.  Take your usually prescribed medications unless otherwise directed.  If you need a refill on your pain medication, please contact our office during regular business hours.  Prescriptions cannot be processed by our office after 5 pm or on weekends.  Start with a light diet upon arrival home, such as soup and crackers or toast.  Be sure to drink plenty of fluids daily.  Resume your normal diet the day after surgery.  Most patients will experience  some swelling and bruising on the chest and neck area.  Ice packs will help.  Swelling and bruising can take several days to resolve.   It is common to experience some constipation after surgery.  Increasing fluid intake and taking a stool softener (Colace) will usually help or prevent this problem.  A mild laxative (Milk of Magnesia or Miralax) should be taken according to package directions if there has been no bowel movement after 48 hours.  You have steri-strips and a gauze dressing over your incision.  You may remove the gauze bandage on the second day after surgery, and you may shower at that time.  Leave your steri-strips (small skin tapes) in place directly over the incision.  These strips should remain on the skin for 5-7 days and then be removed.  You may get them wet in the shower and pat them dry.  You may resume regular (light) daily activities beginning the next day (such as daily self-care, walking, climbing stairs) gradually increasing activities as tolerated.  You may have sexual intercourse when it is comfortable.  Refrain from any heavy lifting or straining until approved by your doctor.  You may drive when you no longer are taking prescription pain medication, you can comfortably wear a seatbelt, and you can safely maneuver your car and apply brakes.  You should see your doctor in the office for a follow-up appointment approximately three weeks after your surgery.  Make sure that you call for this appointment within a day or two after you arrive home to insure a convenient appointment time.  WHEN TO CALL YOUR  DOCTOR: -- Fever greater than 101.5 -- Inability to urinate -- Nausea and/or vomiting - persistent -- Extreme swelling or bruising -- Continued bleeding from incision -- Increased pain, redness, or drainage from the incision -- Difficulty swallowing or breathing -- Muscle cramping or spasms -- Numbness or tingling in hands or around lips  The clinic staff is available to  answer your questions during regular business hours.  Please don't hesitate to call and ask to speak to one of the nurses if you have concerns.  Armandina Gemma, MD [facility] Office: [phone]   Increase activity slowly   Complete by: As directed    No dressing needed   Complete by: As directed      Allergies as of 11/10/2020      Reactions   Sulfa Antibiotics Rash      Medication List    You have not been prescribed any medications.          Discharge Care Instructions  (From admission, onward)         Start     Ordered   11/10/20 0000  No dressing needed        11/10/20 5573          Follow-up Information    Armandina Gemma, MD. Schedule an appointment as soon as possible for a visit in 3 week(s).   Specialty: General Surgery Contact information: [address] [place] [address] [phone]               Armandina Gemma, [facility] Office: [phone]   Signed: Armandina Gemma 11/10/2020, 8:36 AM

## 2020-11-11 LAB — SURGICAL PATHOLOGY

## 2020-11-19 DIAGNOSIS — Z6827 Body mass index (BMI) 27.0-27.9, adult: Secondary | ICD-10-CM | POA: Diagnosis not present

## 2020-11-19 DIAGNOSIS — Z1231 Encounter for screening mammogram for malignant neoplasm of breast: Secondary | ICD-10-CM | POA: Diagnosis not present

## 2020-11-19 DIAGNOSIS — Z01419 Encounter for gynecological examination (general) (routine) without abnormal findings: Secondary | ICD-10-CM | POA: Diagnosis not present

## 2020-11-26 ENCOUNTER — Encounter: Payer: Self-pay | Admitting: Internal Medicine

## 2020-11-26 ENCOUNTER — Other Ambulatory Visit: Payer: Self-pay | Admitting: Internal Medicine

## 2020-11-26 DIAGNOSIS — Z9889 Other specified postprocedural states: Secondary | ICD-10-CM

## 2020-11-26 DIAGNOSIS — E041 Nontoxic single thyroid nodule: Secondary | ICD-10-CM

## 2020-12-09 ENCOUNTER — Other Ambulatory Visit: Payer: Self-pay

## 2020-12-09 ENCOUNTER — Other Ambulatory Visit (INDEPENDENT_AMBULATORY_CARE_PROVIDER_SITE_OTHER): Payer: BC Managed Care – PPO

## 2020-12-09 DIAGNOSIS — Z9889 Other specified postprocedural states: Secondary | ICD-10-CM

## 2020-12-09 DIAGNOSIS — E041 Nontoxic single thyroid nodule: Secondary | ICD-10-CM | POA: Diagnosis not present

## 2020-12-09 LAB — T3, FREE: T3, Free: 2.9 pg/mL (ref 2.3–4.2)

## 2020-12-09 LAB — TSH: TSH: 2.9 u[IU]/mL (ref 0.35–4.50)

## 2020-12-09 LAB — T4, FREE: Free T4: 0.87 ng/dL (ref 0.60–1.60)

## 2021-03-16 DIAGNOSIS — D1723 Benign lipomatous neoplasm of skin and subcutaneous tissue of right leg: Secondary | ICD-10-CM | POA: Diagnosis not present

## 2021-03-16 DIAGNOSIS — D2239 Melanocytic nevi of other parts of face: Secondary | ICD-10-CM | POA: Diagnosis not present

## 2021-04-01 ENCOUNTER — Telehealth: Payer: Self-pay | Admitting: Internal Medicine

## 2021-04-01 NOTE — Telephone Encounter (Signed)
Patient called to request orders be sent in for an [place] for her thyroid her 6 month post surgery  - patient contact [phone]

## 2021-04-01 NOTE — Telephone Encounter (Signed)
I usually recommend to check a thyroid ultrasound approximately 1 year after the surgery...  In her case, I see that she is scheduled to return in March.  We can order it close to that date if ok with her.

## 2021-04-01 NOTE — Telephone Encounter (Signed)
Please advise 

## 2021-04-02 NOTE — Telephone Encounter (Signed)
Called and advised pt of Dr recommendation. Pt agreed and will follow up closer to her follow up appt.

## 2021-04-06 ENCOUNTER — Other Ambulatory Visit: Payer: Self-pay | Admitting: Internal Medicine

## 2021-05-25 DIAGNOSIS — Z9009 Acquired absence of other part of head and neck: Secondary | ICD-10-CM | POA: Diagnosis not present

## 2021-05-25 DIAGNOSIS — C73 Malignant neoplasm of thyroid gland: Secondary | ICD-10-CM | POA: Diagnosis not present

## 2021-06-16 ENCOUNTER — Other Ambulatory Visit: Payer: Self-pay | Admitting: Obstetrics and Gynecology

## 2021-06-16 DIAGNOSIS — N644 Mastodynia: Secondary | ICD-10-CM

## 2021-07-27 ENCOUNTER — Other Ambulatory Visit: Payer: Self-pay | Admitting: Obstetrics and Gynecology

## 2021-07-27 ENCOUNTER — Ambulatory Visit
Admission: RE | Admit: 2021-07-27 | Discharge: 2021-07-27 | Disposition: A | Discharge status: Home / Self Care | Payer: BC Managed Care – PPO | Source: Ambulatory Visit | Attending: Obstetrics and Gynecology | Admitting: Obstetrics and Gynecology

## 2021-07-27 ENCOUNTER — Ambulatory Visit: Payer: BC Managed Care – PPO

## 2021-07-27 DIAGNOSIS — N644 Mastodynia: Secondary | ICD-10-CM | POA: Diagnosis not present

## 2021-07-27 DIAGNOSIS — R922 Inconclusive mammogram: Secondary | ICD-10-CM | POA: Diagnosis not present

## 2021-08-09 ENCOUNTER — Telehealth: Payer: Self-pay | Admitting: Internal Medicine

## 2021-08-09 NOTE — Telephone Encounter (Signed)
Pt is calling in wanting to have orders for her to get a [place] of her thyroid and would like to have it done at [facility]).  Pt stated that she will have an appointment 09/01/2021 and would like to have it done before she comes in.

## 2021-08-10 ENCOUNTER — Other Ambulatory Visit: Payer: Self-pay | Admitting: Internal Medicine

## 2021-08-10 DIAGNOSIS — C73 Malignant neoplasm of thyroid gland: Secondary | ICD-10-CM

## 2021-08-10 NOTE — Telephone Encounter (Signed)
Called and Lvm advising pt orders placed for U/S they should be contacted pt to schedule appt. [facility] [phone]

## 2021-08-18 ENCOUNTER — Ambulatory Visit
Admission: RE | Admit: 2021-08-18 | Discharge: 2021-08-18 | Disposition: A | Discharge status: Home / Self Care | Payer: BC Managed Care – PPO | Source: Ambulatory Visit | Attending: Internal Medicine | Admitting: Internal Medicine

## 2021-08-18 DIAGNOSIS — C73 Malignant neoplasm of thyroid gland: Secondary | ICD-10-CM

## 2021-08-18 DIAGNOSIS — E890 Postprocedural hypothyroidism: Secondary | ICD-10-CM | POA: Diagnosis not present

## 2021-09-01 ENCOUNTER — Other Ambulatory Visit: Payer: Self-pay

## 2021-09-01 ENCOUNTER — Ambulatory Visit (INDEPENDENT_AMBULATORY_CARE_PROVIDER_SITE_OTHER): Payer: BC Managed Care – PPO | Admitting: Internal Medicine

## 2021-09-01 ENCOUNTER — Encounter: Payer: Self-pay | Admitting: Internal Medicine

## 2021-09-01 VITALS — BP 120/68 | HR 86 | Ht 65.0 in | Wt 173.6 lb

## 2021-09-01 DIAGNOSIS — Z9889 Other specified postprocedural states: Secondary | ICD-10-CM | POA: Diagnosis not present

## 2021-09-01 LAB — T3, FREE: T3, Free: 3.4 pg/mL (ref 2.3–4.2)

## 2021-09-01 LAB — TSH: TSH: 2.74 u[IU]/mL (ref 0.35–5.50)

## 2021-09-01 LAB — T4, FREE: Free T4: 0.74 ng/dL (ref 0.60–1.60)

## 2021-09-01 NOTE — Progress Notes (Signed)
Patient ID: Kelly Osborne, female   DOB: 11/20/1975, 46 y.o.   MRN: [id number]   This visit occurred during the SARS-CoV-2 public health emergency.  Safety protocols were in place, including screening questions prior to the visit, additional usage of staff PPE, and extensive cleaning of exam room while observing appropriate contact time as indicated for disinfecting solutions.   HPI  Kelly Osborne is a 46 y.o.-year-old female, initially referred by her OB/GYN provider, Dr. Gustavo Lah, NP, returning for follow-up for goiter and thyroid nodule.  Last visit 1 year ago.  Interim history: Since last visit, patient had right thyroidectomy by Dr. Harlow Asa in 11/2020.  She is feeling well after the surgery, without complaints.  We did not have to start levothyroxine afterwards as her TFTs have been normal. She denies fatigue, constipation, cold intolerance, hair loss.  She did gain weight, but she attributes this to coming off the [employer].   Reviewed history: Patient was found to have a goiter during evaluation by GYN.  She was sent to have a thyroid ultrasound in 07/2018.  This showed a normal-sized thyroid with the left lobe slightly larger than the right, containing a 1.2 cm thyroid nodule.  At that time, it was unclear whether this was a true nodule versus a pseudonodule (inflammatory site).  Thyroid U/S (07/26/2018): Normal-sized thyroid with 1 nodule versus pseudo-nodule of 1.2 cm:  Parenchymal Echotexture: Mildly heterogenous Isthmus: 0.4 cm Right lobe: 4.9 x 1.5 x 1.9 cm Left lobe: 5.2 x 1.5 x 1.7 cm _________________________________________________________   Nodule # 1: Location: Right; Inferior Maximum size: 1.2 cm; Other 2 dimensions: 0.5 x 0.9 cm Composition: solid/almost completely solid (2) Echogenic foci: macrocalcifications (1) *Given size (>/= 1 - 1.4 cm) and appearance, a follow-up ultrasound in 1 year should be considered based on TI-RADS  criteria. _________________________________________________  IMPRESSION: 1. Mildly heterogeneous and enlarged thyroid gland. 2. A 1.2 cm TI-RADS category 4 (moderately suspicious) nodule in the medial aspect of the right inferior gland meets criteria for follow-up ultrasound at 1 year. Of note, on some of the images, this "nodule" appears more like a posterior lobulation of the normal thyroid tissue that happens to have an adjacent dystrophic calcification. This may actually represent a pseudonodule.     Thyroid U/S (08/20/2019): Parenchymal Echotexture: Normal Isthmus: 2 mm Right lobe: 4.8 x 1.1 x 2.0 cm Left lobe: 5.4 x 1.5 x 1.4 cm _________________________________________________________   Nodule # 1: Location: Right; Mid Maximum size: 1.4, previously 1.2 cm; Other 2 dimensions: 0.9 x 0.4 cm Composition: solid/almost completely solid (2) Echogenicity: hypoechoic (2) Echogenic foci: macrocalcifications (1) *Given size (>/= 1 - 1.4 cm) and appearance, a follow-up ultrasound in 1 year should be considered based on TI-RADS criteria.  ______________________________________________________   There are a few scattered bilateral subcentimeter thyroid cystic nodules all measuring 5 mm or less in size.   Normal thyroid echogenicity. No hypervascularity or regional adenopathy.   IMPRESSION: 1.4 cm right mid thyroid TR 4 nodule meets criteria for follow-up in 1 year.     Thyroid U/S (08/18/2020): Parenchymal Echotexture: Normal  Isthmus: 0.3 cm Right lobe: 5 x 1.3 x 1.8 cm Left lobe: 4.2 x 1.4 x 1.7 cm ____________________________________________________________   Nodule # 1: Prior biopsy: No  Location: Right; Mid Maximum size: 1.6 cm; Other 2 dimensions: 1 x 0.6 cm, previously, 1.4 x 0.9 x 0.4 cm Composition: solid/almost completely solid (2) Echogenicity: hypoechoic (2) Echogenic foci: macrocalcifications (1)  **Given size (>/= 1.5 cm)  and appearance, fine needle  aspiration of this moderately suspicious nodule should be considered based on TI-RADS criteria.  _________________________________________________________   IMPRESSION: Relatively stable previously demonstrated thyroid nodule in the right mid thyroid gland. While this thyroid nodule technically now meets criteria for fine-needle aspiration, a 1 year follow-up ultrasound is likely appropriate given its minimal interval change in size and appearance.    FNA (09/08/2020): AUS, Afirma: Suspicious  Right thyroidectomy (11/09/2020) by Dr. Harlow Asa.   A. THYROID, RIGHT, LOBECTOMY:  - Multifocal papillary thyroid carcinoma, follicular variant.  - Margins not involved.  - No extrathyroidal extension.  - See oncology table.   ONCOLOGY TABLE:  THYROID GLAND, CARCINOMA: Resection  Procedure: Right thyroid lobectomy.  Tumor Focality: Multifocal.  Tumor Site: Right thyroid lobe.  Tumor Size: 0.4 (calcified nodule), 0.2 and 0.1 cm.  Histologic Type: Follicular variant of papillary carcinoma.  Angioinvasion: Not identified.  Lymphatic Invasion: Not identified.  Extrathyroidal Extension: Not identified.  Margin Status: All margins negative for carcinoma.  Regional Lymph Node Status: No lymph nodes submitted.  Pathologic Stage Classification (pTNM, AJCC 8th Edition): pT1a, pN Not assigned.  .  Comment(s): There is a 0.4 cm partially calcified nodule with features  of follicular variant of papillary thyroid carcinoma.  In addition,  there are 2 smaller nodules in the right lobe which are 0.2 and 0.1 cm  with features consistent with follicular variant of papillary carcinoma.   Neck U/S (08/18/2021): No nodules in the left lobe, only a small anechoic cyst, not worrisome  Reviewed her TFTs: 05/25/2021: TSH 2.02 Lab Results  Component Value Date   TSH 2.90 12/09/2020   TSH 1.46 08/27/2020   FREET4 0.87 12/09/2020  06/29/2018: TSH 1.170, free T4 0.99 09/07/2016: TSH 1.170, free T4  1.22 08/11/2008: TSH 1.6   Pt denies: - hoarseness - dysphagia - choking - SOB with lying down  + FH of thyroid ds.- MGM with hypothyroidism. No FH of thyroid cancer. No h/o radiation tx to head or neck. No herbal supplements. No Biotin use. No recent steroids use.  She was on essential oils, vitamin D and Elderberry Gummies at last visit >> now off.  She had a partial hysterectomy 01/2017, after a uterine ablation was unsuccessful for management of uterine fibroids. She has a history of a heart murmur, gestational diabetes  ROS:+ see HPI  I reviewed pt's medications, allergies, PMH, social hx, family hx, and changes were documented in the history of present illness. Otherwise, unchanged from my initial visit note.  Past Medical History:  Diagnosis Date   Heart murmur    Past Surgical History:  Procedure Laterality Date   DILATATION & CURETTAGE/HYSTEROSCOPY WITH MYOSURE N/A 07/21/2016   Procedure: DILATATION & CURETTAGE/HYSTEROSCOPY WITH MYOSURE;  Surgeon: Servando Salina, MD;  Location: [facility];  Service: Gynecology;  Laterality: N/A;   HYSTEROSCOPY N/A 07/21/2016   Procedure: HYSTEROSCOPY WITH HYDROTHERMAL ABLATION;  Surgeon: Servando Salina, MD;  Location: [facility];  Service: Gynecology;  Laterality: N/A;   LAPAROSCOPIC VAGINAL HYSTERECTOMY WITH SALPINGECTOMY Bilateral 01/12/2017   Procedure: LAPAROSCOPIC ASSISTED VAGINAL HYSTERECTOMY WITH SALPINGECTOMY;  Surgeon: Servando Salina, MD;  Location: [facility];  Service: Gynecology;  Laterality: Bilateral;   MYRINGOTOMY     THYROID LOBECTOMY Right 11/09/2020   Procedure: RIGHT THYROID LOBECTOMY;  Surgeon: Armandina Gemma, MD;  Location: [facility];  Service: General;  Laterality: Right;   WISDOM TOOTH EXTRACTION     Social History   Socioeconomic History   Marital status: Married    Spouse name:  Not on file   Number of children: 2   Years of education: Not on file   Highest education level: Not on file  Occupational History    CPA,  Arts administrator   Smoking status: Never Smoker   Smokeless tobacco: Never Used  Substance and Sexual Activity   Alcohol use: Yes    Comment: rare   Drug use: No   She is not taking any prescription medications.  Allergies  Allergen Reactions   Sulfa Antibiotics Rash   Family history: Diabetes mellitus in great grandmothers, great aunts and uncles HTN in grandfather, great aunt hyperlipidemia and grandmother Heart disease in grandfather and great-grandmother, heart murmur in mother and brother Lung and liver cancer in grandfather Thyroid disease in the grandmother  PE: BP 120/68 (BP Location: Left Arm, Patient Position: Sitting, Cuff Size: Normal)    Pulse 86    Ht $R'5\' 5"'qB$  (1.651 m)    Wt 173 lb 9.6 oz (78.7 kg)    LMP 12/23/2016 (Exact Date)    SpO2 98%    BMI 28.89 kg/m  Wt Readings from Last 3 Encounters:  09/01/21 173 lb 9.6 oz (78.7 kg)  11/09/20 159 lb (72.1 kg)  10/30/20 159 lb (72.1 kg)   Constitutional: overweight, in NAD Eyes: PERRLA, EOMI, no exophthalmos ENT: moist mucous membranes, surgical scar healed, without any induration, erythema, keloid, no cervical lymphadenopathy Cardiovascular: RRR, No MRG Respiratory: CTA B Musculoskeletal: no deformities, strength intact in all 4 Skin: moist, warm, no rashes Neurological: no tremor with outstretched hands, DTR normal in all 4  ASSESSMENT: 1.  Follicular variant of papillary thyroid cancer  2.  Status post right thyroidectomy  PLAN: 1.  Follicular variant of PTC -Patient has a history of a right thyroid nodule observed in the right inferior lobe, initially, measuring 1.2 cm on the first ultrasound in 07/2018, but which increased in size to 1.6 cm on her ultrasound from 08/2020.  At that time, due to the increasing size, it met criteria for biopsy.  An FNA returned inconclusive: Atypia of undetermined significance, however, the Afirma molecular marker returned suspicious.  Patient was referred to Dr. Harlow Asa  and she had right thyroidectomy on 11/09/2020.  Final pathology showed multifocal subcentimeter follicular variant of papillary thyroid cancer.  There was no extrathyroidal or lymphovascular extension and margins were not involved. -At that time, I did not suggest completion thyroidectomy since the left lobe appears to not contain any nodules.  Therefore, she did not have RAI treatment. -She had repeat thyroid tests in 12/2020 and 05/2021 and these were normal -We checked another thyroid ultrasound 2 weeks ago and this did not show any nodules in the left thyroid nodule (aside a small anechoic cyst) or any suspicious masses in the right thyroid. -No intervention is needed for now, I plan to repeat another thyroid ultrasound in 2 years  2. S/p right thyroid lobectomy -She denies hypothyroid symptoms but did gain 14 pounds since last visit, which she attributes to change in diet -She had normal TFTs x2  after her right lobectomy so no levothyroxine was needed -We will repeat her TFTs today -We will continue to follow her clinically and biochemically   Component     Latest Ref Rng & Units 09/01/2021  TSH     0.35 - 5.50 uIU/mL 2.74  Triiodothyronine,Free,Serum     2.3 - 4.2 pg/mL 3.4  T4,Free(Direct)     0.60 - 1.60 ng/dL 0.74  Thyroid tests remain  normal.  Philemon Kingdom, MD PhD [facility]

## 2021-09-01 NOTE — Patient Instructions (Signed)
Please stop at the lab.  Please come back for a follow-up appointment in 1 year.  

## 2022-02-28 DIAGNOSIS — Z6831 Body mass index (BMI) 31.0-31.9, adult: Secondary | ICD-10-CM | POA: Diagnosis not present

## 2022-02-28 DIAGNOSIS — Z01419 Encounter for gynecological examination (general) (routine) without abnormal findings: Secondary | ICD-10-CM | POA: Diagnosis not present

## 2022-03-04 IMAGING — DX DG CHEST 2V
2 series · 2 of 2 positions shown · non-contrast
Comparison: None.

CLINICAL DATA: Preoperative study.

EXAM:
CHEST - 2 VIEW

[chest pa]
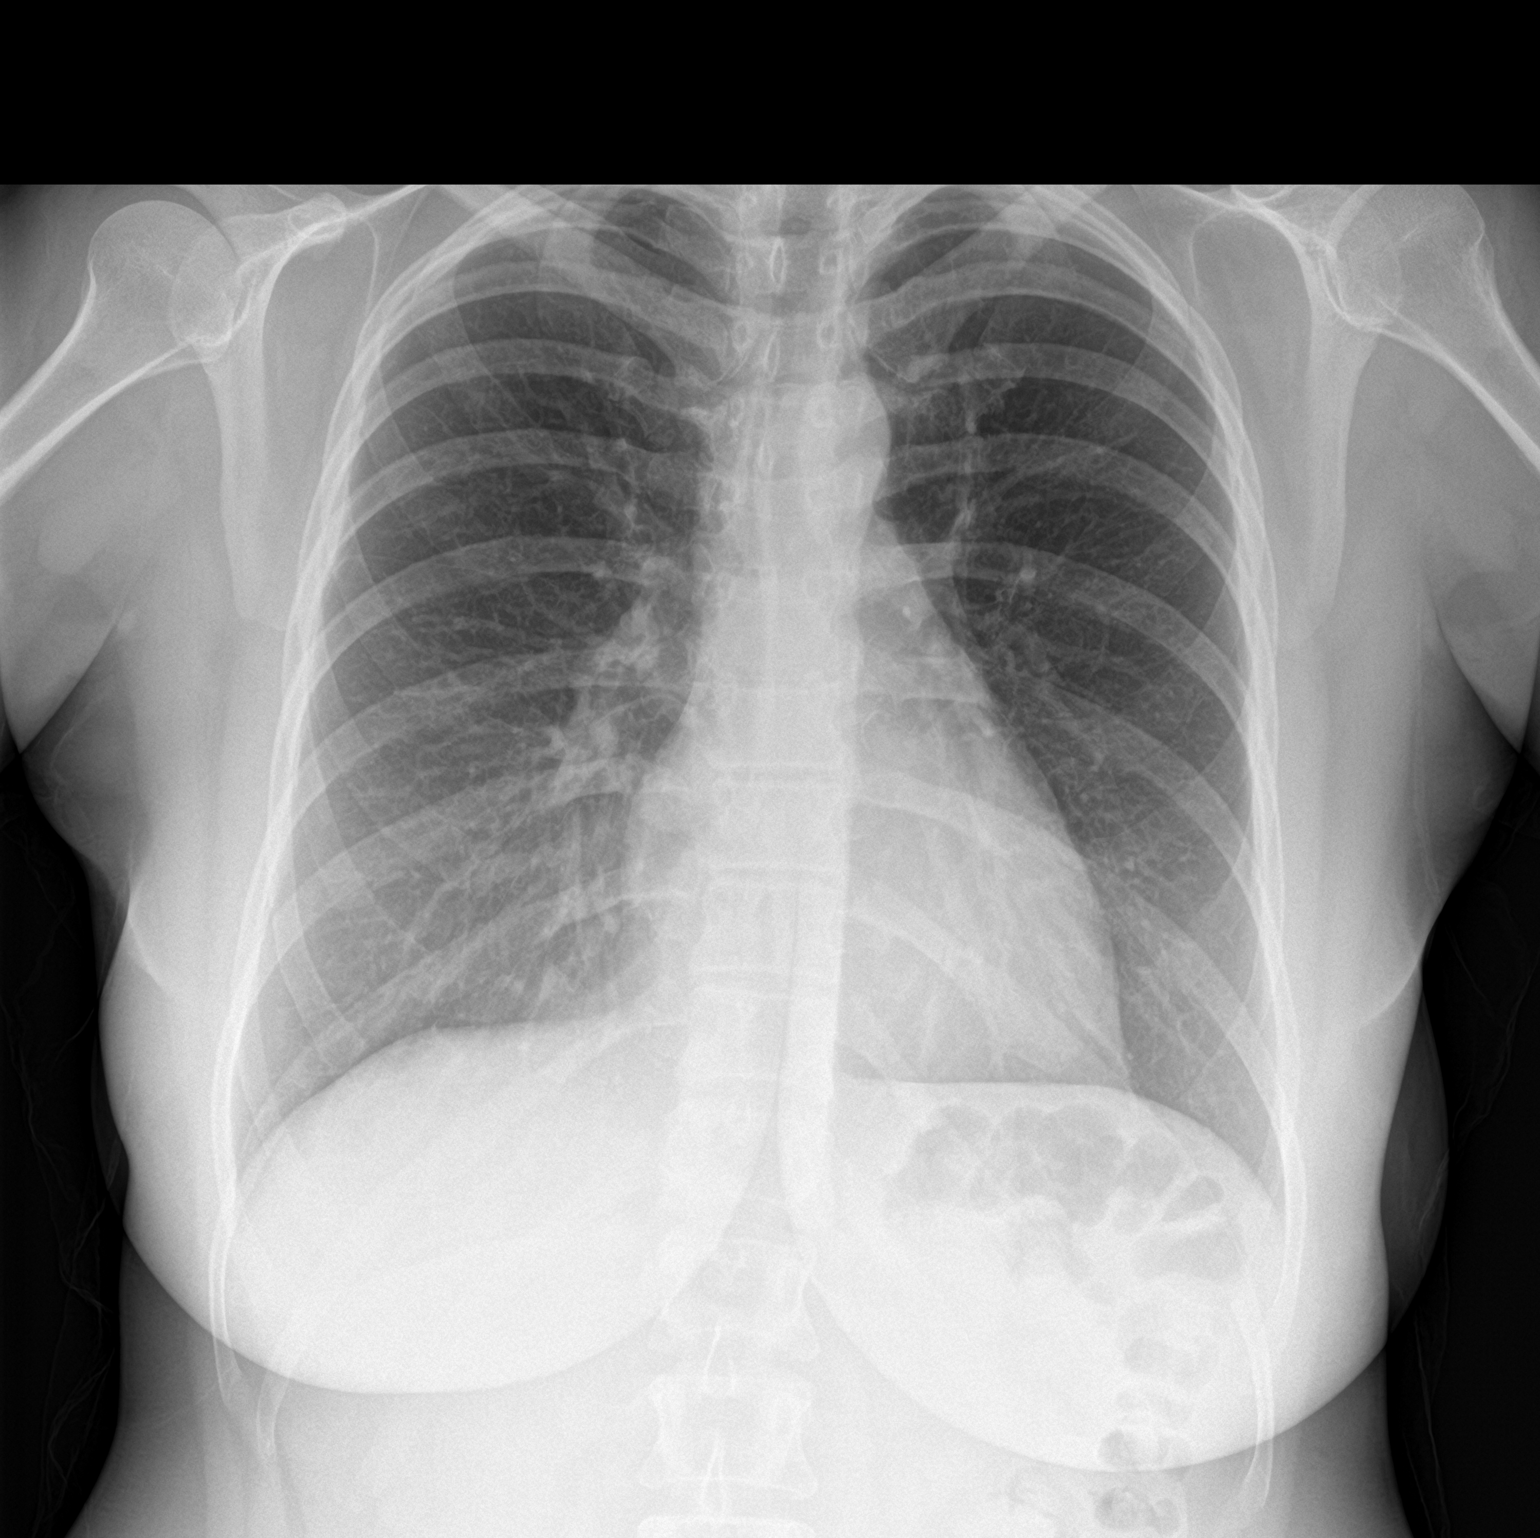

[chest lat]
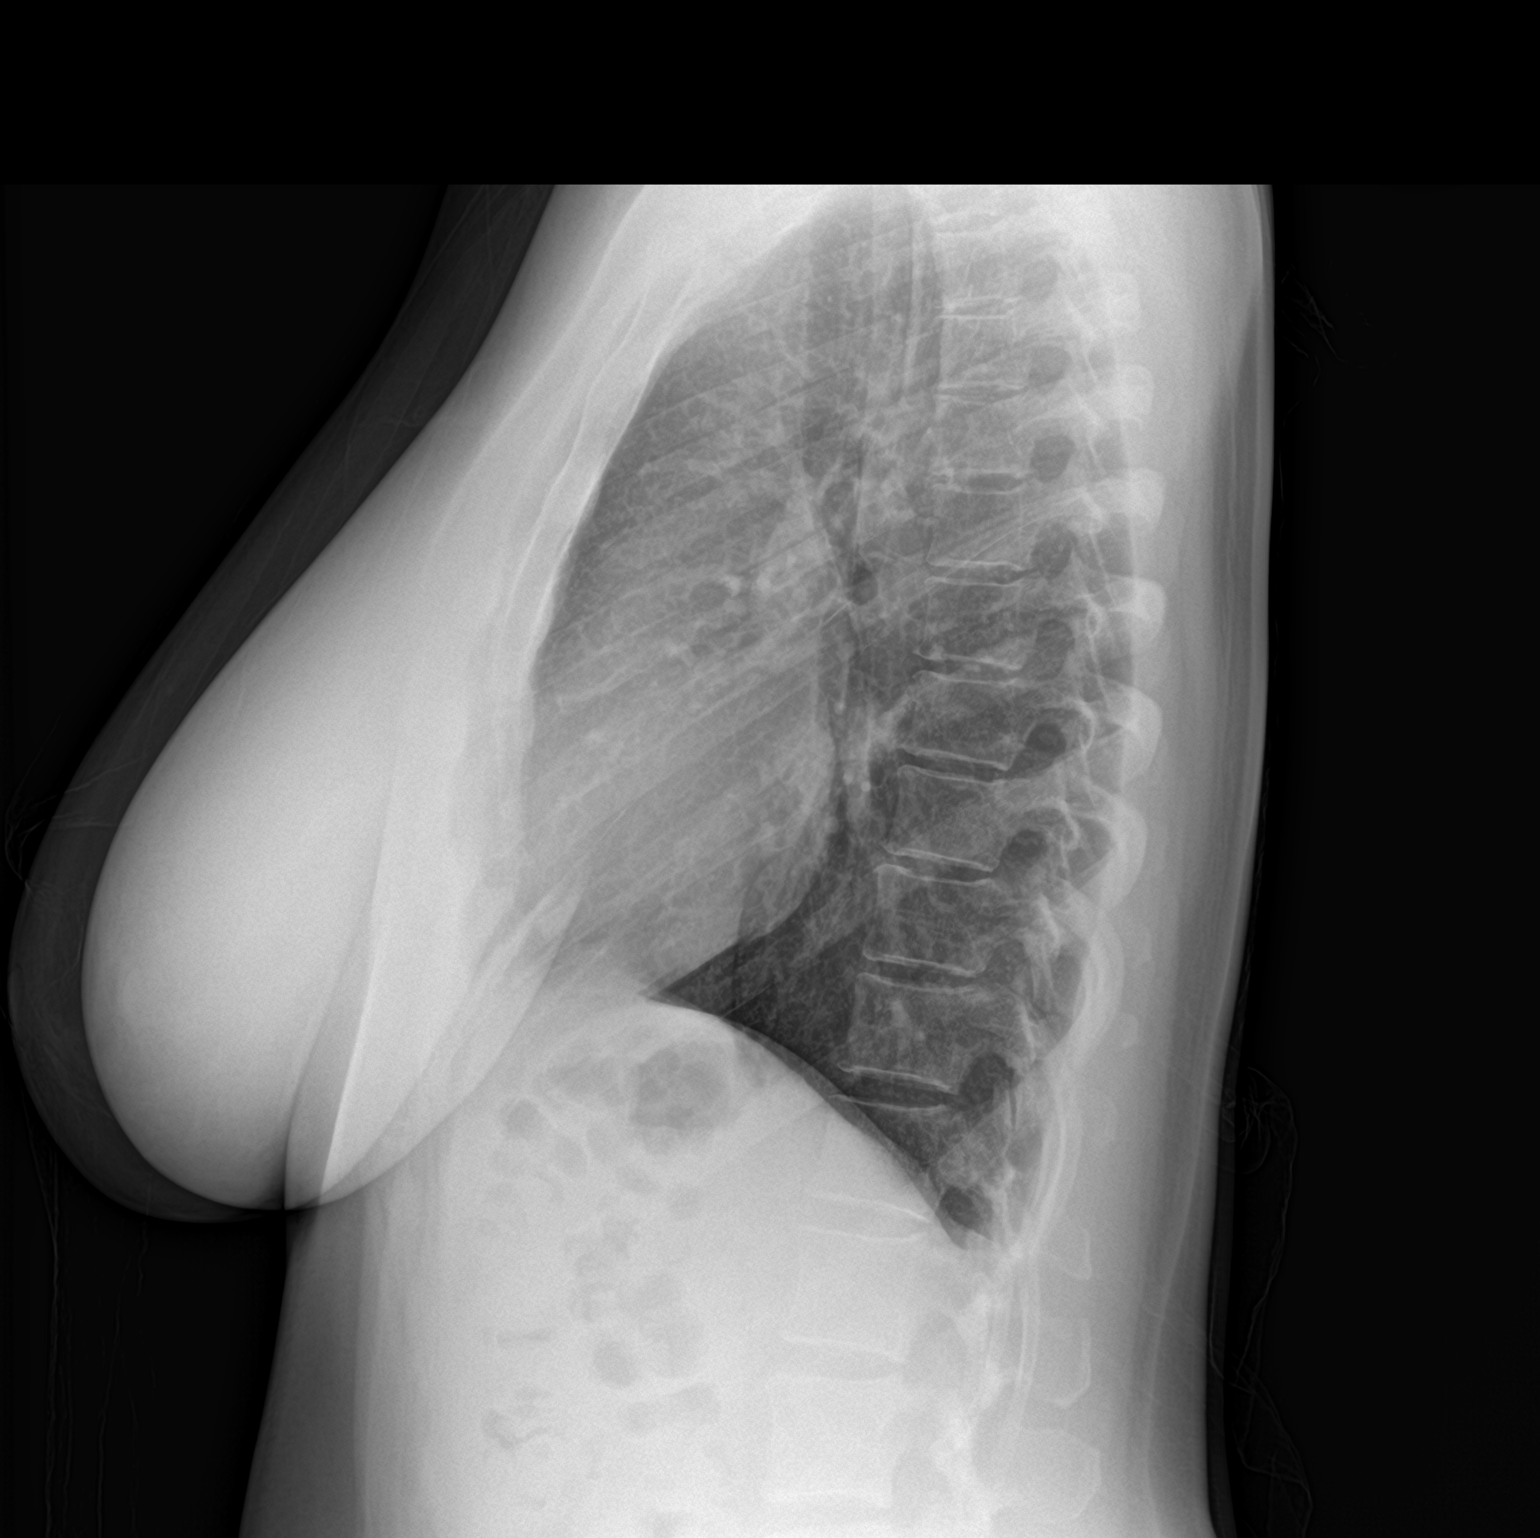

[2 of 2 positions shown; findings below may reference images not displayed]

FINDINGS: The heart size and mediastinal contours are within normal limits.
Both lungs are clear. The visualized skeletal structures are
unremarkable.
IMPRESSION: No active cardiopulmonary disease.

## 2022-07-20 ENCOUNTER — Encounter: Payer: Self-pay | Admitting: Internal Medicine

## 2022-07-21 ENCOUNTER — Other Ambulatory Visit: Payer: Self-pay | Admitting: Internal Medicine

## 2022-07-21 DIAGNOSIS — Z9889 Other specified postprocedural states: Secondary | ICD-10-CM

## 2022-07-21 DIAGNOSIS — R1310 Dysphagia, unspecified: Secondary | ICD-10-CM

## 2022-07-21 DIAGNOSIS — C73 Malignant neoplasm of thyroid gland: Secondary | ICD-10-CM

## 2022-08-09 ENCOUNTER — Ambulatory Visit
Admission: RE | Admit: 2022-08-09 | Discharge: 2022-08-09 | Disposition: A | Discharge status: Home / Self Care | Payer: BC Managed Care – PPO | Source: Ambulatory Visit | Attending: Internal Medicine | Admitting: Internal Medicine

## 2022-08-09 DIAGNOSIS — C73 Malignant neoplasm of thyroid gland: Secondary | ICD-10-CM

## 2022-08-09 DIAGNOSIS — Z9889 Other specified postprocedural states: Secondary | ICD-10-CM

## 2022-08-09 DIAGNOSIS — R1310 Dysphagia, unspecified: Secondary | ICD-10-CM | POA: Diagnosis not present

## 2022-08-09 DIAGNOSIS — E890 Postprocedural hypothyroidism: Secondary | ICD-10-CM | POA: Diagnosis not present

## 2022-08-09 DIAGNOSIS — E041 Nontoxic single thyroid nodule: Secondary | ICD-10-CM | POA: Diagnosis not present

## 2022-09-06 ENCOUNTER — Ambulatory Visit (INDEPENDENT_AMBULATORY_CARE_PROVIDER_SITE_OTHER): Payer: BC Managed Care – PPO | Admitting: Internal Medicine

## 2022-09-06 ENCOUNTER — Encounter: Payer: Self-pay | Admitting: Internal Medicine

## 2022-09-06 VITALS — BP 122/80 | HR 87 | Ht 65.0 in | Wt 184.6 lb

## 2022-09-06 DIAGNOSIS — C73 Malignant neoplasm of thyroid gland: Secondary | ICD-10-CM | POA: Diagnosis not present

## 2022-09-06 DIAGNOSIS — Z9889 Other specified postprocedural states: Secondary | ICD-10-CM | POA: Diagnosis not present

## 2022-09-06 LAB — T3, FREE: T3, Free: 3.6 pg/mL (ref 2.3–4.2)

## 2022-09-06 LAB — TSH: TSH: 2.62 u[IU]/mL (ref 0.35–5.50)

## 2022-09-06 LAB — T4, FREE: Free T4: 0.7 ng/dL (ref 0.60–1.60)

## 2022-09-06 NOTE — Patient Instructions (Signed)
Please stop at the lab.  Please come back for a follow-up appointment in 1 year.  

## 2022-09-06 NOTE — Progress Notes (Signed)
Patient ID: Kelly Osborne, female   DOB: 1976/04/26, 47 y.o.   MRN: [id number]   HPI  Kelly Osborne is a 47 y.o.-year-old female, initially referred by her OB/GYN provider, Dr. Gustavo Lah, NP, returning for follow-up for goiter and thyroid nodule.  Last visit 1 year ago.  Interim history: Patient had right thyroidectomy by Dr. Harlow Asa in 11/2020.  She feels well after surgery, without complaints.  She occasionally feels something bothering her when swallowing at the site of the scar. She denies fatigue, constipation, cold intolerance, hair loss.  She did gain weight, 11 pounds since last visit  Reviewed history: Patient was found to have a goiter during evaluation by GYN.  She was sent to have a thyroid ultrasound in 07/2018.  This showed a normal-sized thyroid with the left lobe slightly larger than the right, containing a 1.2 cm thyroid nodule.  At that time, it was unclear whether this was a true nodule versus a pseudonodule (inflammatory site).  Thyroid U/S (07/26/2018): Normal-sized thyroid with 1 nodule versus pseudo-nodule of 1.2 cm:  Parenchymal Echotexture: Mildly heterogenous Isthmus: 0.4 cm Right lobe: 4.9 x 1.5 x 1.9 cm Left lobe: 5.2 x 1.5 x 1.7 cm _________________________________________________________   Nodule # 1: Location: Right; Inferior Maximum size: 1.2 cm; Other 2 dimensions: 0.5 x 0.9 cm Composition: solid/almost completely solid (2) Echogenic foci: macrocalcifications (1) *Given size (>/= 1 - 1.4 cm) and appearance, a follow-up ultrasound in 1 year should be considered based on TI-RADS criteria. _________________________________________________  IMPRESSION: 1. Mildly heterogeneous and enlarged thyroid gland. 2. A 1.2 cm TI-RADS category 4 (moderately suspicious) nodule in the medial aspect of the right inferior gland meets criteria for follow-up ultrasound at 1 year. Of note, on some of the images, this "nodule" appears more like a posterior lobulation of  the normal thyroid tissue that happens to have an adjacent dystrophic calcification. This may actually represent a pseudonodule.     Thyroid U/S (08/20/2019): Parenchymal Echotexture: Normal Isthmus: 2 mm Right lobe: 4.8 x 1.1 x 2.0 cm Left lobe: 5.4 x 1.5 x 1.4 cm _________________________________________________________   Nodule # 1: Location: Right; Mid Maximum size: 1.4, previously 1.2 cm; Other 2 dimensions: 0.9 x 0.4 cm Composition: solid/almost completely solid (2) Echogenicity: hypoechoic (2) Echogenic foci: macrocalcifications (1) *Given size (>/= 1 - 1.4 cm) and appearance, a follow-up ultrasound in 1 year should be considered based on TI-RADS criteria.  ______________________________________________________   There are a few scattered bilateral subcentimeter thyroid cystic nodules all measuring 5 mm or less in size.   Normal thyroid echogenicity. No hypervascularity or regional adenopathy.   IMPRESSION: 1.4 cm right mid thyroid TR 4 nodule meets criteria for follow-up in 1 year.     Thyroid U/S (08/18/2020): Parenchymal Echotexture: Normal  Isthmus: 0.3 cm Right lobe: 5 x 1.3 x 1.8 cm Left lobe: 4.2 x 1.4 x 1.7 cm ____________________________________________________________   Nodule # 1: Prior biopsy: No  Location: Right; Mid Maximum size: 1.6 cm; Other 2 dimensions: 1 x 0.6 cm, previously, 1.4 x 0.9 x 0.4 cm Composition: solid/almost completely solid (2) Echogenicity: hypoechoic (2) Echogenic foci: macrocalcifications (1)  **Given size (>/= 1.5 cm) and appearance, fine needle aspiration of this moderately suspicious nodule should be considered based on TI-RADS criteria.  _________________________________________________________   IMPRESSION: Relatively stable previously demonstrated thyroid nodule in the right mid thyroid gland. While this thyroid nodule technically now meets criteria for fine-needle aspiration, a 1 year follow-up ultrasound is likely  appropriate given its  minimal interval change in size and appearance.    FNA (09/08/2020): AUS, Afirma: Suspicious  Right thyroidectomy (11/09/2020) by Dr. Harlow Asa.   A. THYROID, RIGHT, LOBECTOMY:  - Multifocal papillary thyroid carcinoma, follicular variant.  - Margins not involved.  - No extrathyroidal extension.  - See oncology table.   ONCOLOGY TABLE:  THYROID GLAND, CARCINOMA: Resection  Procedure: Right thyroid lobectomy.  Tumor Focality: Multifocal.  Tumor Site: Right thyroid lobe.  Tumor Size: 0.4 (calcified nodule), 0.2 and 0.1 cm.  Histologic Type: Follicular variant of papillary carcinoma.  Angioinvasion: Not identified.  Lymphatic Invasion: Not identified.  Extrathyroidal Extension: Not identified.  Margin Status: All margins negative for carcinoma.  Regional Lymph Node Status: No lymph nodes submitted.  Pathologic Stage Classification (pTNM, AJCC 8th Edition): pT1a, pN Not assigned.  .  Comment(s): There is a 0.4 cm partially calcified nodule with features  of follicular variant of papillary thyroid carcinoma.  In addition,  there are 2 smaller nodules in the right lobe which are 0.2 and 0.1 cm  with features consistent with follicular variant of papillary carcinoma.   Neck U/S (08/18/2021): No nodules in the left lobe, only a small anechoic cyst, not worrisome  Neck U/S (08/09/2022):  4 mm cystic nodule in the inferior left thyroid lobe does not meet criteria for imaging surveillance or FNA. The isthmus and left thyroid lobe are otherwise unremarkable.   IMPRESSION: 1. No abnormality of the right thyroidectomy bed. 2. No significant sonographic abnormality of the isthmus or left thyroid lobe.  Reviewed her TFTs: Lab Results  Component Value Date   TSH 2.74 09/01/2021   TSH 2.90 12/09/2020   TSH 1.46 08/27/2020   FREET4 0.74 09/01/2021   FREET4 0.87 12/09/2020  05/25/2021: TSH 2.02 06/29/2018: TSH 1.170, free T4 0.99 09/07/2016: TSH 1.170, free T4  1.22 08/11/2008: TSH 1.6   Pt denies: - hoarseness - dysphagia - choking - SOB with lying down  + FH of thyroid ds.- MGM with hypothyroidism. No FH of thyroid cancer. No h/o radiation tx to head or neck. No herbal supplements. No Biotin use. No recent steroids use.   She was on essential oils, vitamin D and Elderberry Gummies at last visit >> now off.  She had a partial hysterectomy 01/2017, after a uterine ablation was unsuccessful for management of uterine fibroids. She has a history of a heart murmur, gestational diabetes  ROS:+ see HPI  I reviewed pt's medications, allergies, PMH, social hx, family hx, and changes were documented in the history of present illness. Otherwise, unchanged from my initial visit note.  Past Medical History:  Diagnosis Date   Heart murmur    Past Surgical History:  Procedure Laterality Date   DILATATION & CURETTAGE/HYSTEROSCOPY WITH MYOSURE N/A 07/21/2016   Procedure: DILATATION & CURETTAGE/HYSTEROSCOPY WITH MYOSURE;  Surgeon: Servando Salina, MD;  Location: [facility];  Service: Gynecology;  Laterality: N/A;   HYSTEROSCOPY N/A 07/21/2016   Procedure: HYSTEROSCOPY WITH HYDROTHERMAL ABLATION;  Surgeon: Servando Salina, MD;  Location: [facility];  Service: Gynecology;  Laterality: N/A;   LAPAROSCOPIC VAGINAL HYSTERECTOMY WITH SALPINGECTOMY Bilateral 01/12/2017   Procedure: LAPAROSCOPIC ASSISTED VAGINAL HYSTERECTOMY WITH SALPINGECTOMY;  Surgeon: Servando Salina, MD;  Location: [facility];  Service: Gynecology;  Laterality: Bilateral;   MYRINGOTOMY     THYROID LOBECTOMY Right 11/09/2020   Procedure: RIGHT THYROID LOBECTOMY;  Surgeon: Armandina Gemma, MD;  Location: [facility];  Service: General;  Laterality: Right;   WISDOM TOOTH EXTRACTION     Social History   Socioeconomic  History   Marital status: Married    Spouse name: Not on file   Number of children: 2   Years of education: Not on file   Highest education level: Not on file  Occupational History     CPA, Arts administrator   Smoking status: Never Smoker   Smokeless tobacco: Never Used  Substance and Sexual Activity   Alcohol use: Yes    Comment: rare   Drug use: No   She is not taking any prescription medications.  Allergies  Allergen Reactions   Sulfa Antibiotics Rash   Family history: Diabetes mellitus in great grandmothers, great aunts and uncles HTN in grandfather, great aunt hyperlipidemia and grandmother Heart disease in grandfather and great-grandmother, heart murmur in mother and brother Lung and liver cancer in grandfather Thyroid disease in the grandmother  PE: BP 122/80 (BP Location: Right Arm, Patient Position: Sitting, Cuff Size: Normal)   Pulse 87   Ht '5\' 5"'$  (1.651 m)   Wt 184 lb 9.6 oz (83.7 kg)   LMP 12/23/2016 (Exact Date)   SpO2 98%   BMI 30.72 kg/m  Wt Readings from Last 3 Encounters:  09/06/22 184 lb 9.6 oz (83.7 kg)  09/01/21 173 lb 9.6 oz (78.7 kg)  11/09/20 159 lb (72.1 kg)   Constitutional: overweight, in NAD Eyes: EOMI, no exophthalmos ENT:  surgical scar healed, without any induration, erythema, keloid, no cervical lymphadenopathy Cardiovascular: RRR, No MRG Respiratory: CTA B Musculoskeletal: no deformities Skin: no rashes Neurological: no tremor with outstretched hands  ASSESSMENT: 1.  Follicular variant of papillary thyroid cancer  2.  Status post right thyroidectomy  PLAN: 1.  Follicular variant of PTC -Patient has a history of a right thyroid nodule observed in the right inferior lobe, initially, measuring 1.2 cm on the first ultrasound in 07/2018, but which increased in size to 1.6 cm on her ultrasound from 08/2020.  At that time, due to the increasing size, it met criteria for biopsy.  An FNA returned inconclusive: Atypia of undetermined significance, however, the Afirma molecular marker returned suspicious.  Patient was referred to Dr. Harlow Asa and she had right thyroidectomy on 11/09/2020.  Final pathology showed  multifocal subcentimeter follicular variant of papillary thyroid cancer.  There was no extrathyroidal or lymphovascular extension and margins were not involved. -At that time, I did not suggest completion thyroidectomy since the left lobe appeared not to contain any nodules.  Therefore, she did not have RAI treatment -We checked another thyroid ultrasound prior to our last visit and again last month (after she contacted me with feeling something low in her neck whenever she swallowing) and these showed no nodules in the left lobe aside a small anechoic cyst and no suspicious masses in the right thyroid bed.  We discussed that for this, her swallowing could be related to small amount of scar tissue -No intervention is needed for now, plan to repeat another ultrasound in 2 years after the latest  2. S/p right thyroid lobectomy -No need for levothyroxine -Latest TFTs were reviewed from 09/01/2021 and these were all normal -she did gain 11 lbs since last OV, but no hypothyroid sxs OTW -Will continue to follow her clinically and biochemically -We will recheck her TFTs today  Component     Latest Ref Rng 09/06/2022  TSH     0.35 - 5.50 uIU/mL 2.62   Triiodothyronine,Free,Serum     2.3 - 4.2 pg/mL 3.6   T4,Free(Direct)     0.60 - 1.60 ng/dL 0.70  Normal TFTs.  Philemon Kingdom, MD PhD [facility] Endocrinology

## 2022-12-21 IMAGING — US US THYROID
1 series · 13 of 25 positions shown · non-contrast
Comparison: Prior ultrasound 08/18/2020

CLINICAL DATA: Other. History of prior right hemithyroidectomy in

EXAM:
THYROID ULTRASOUND
TECHNIQUE: Ultrasound examination of the thyroid gland and adjacent soft
tissues was performed.

[Series 1: us thyroid · 0.03mm/px · 13 of 38 slices shown]
[im 1/38]
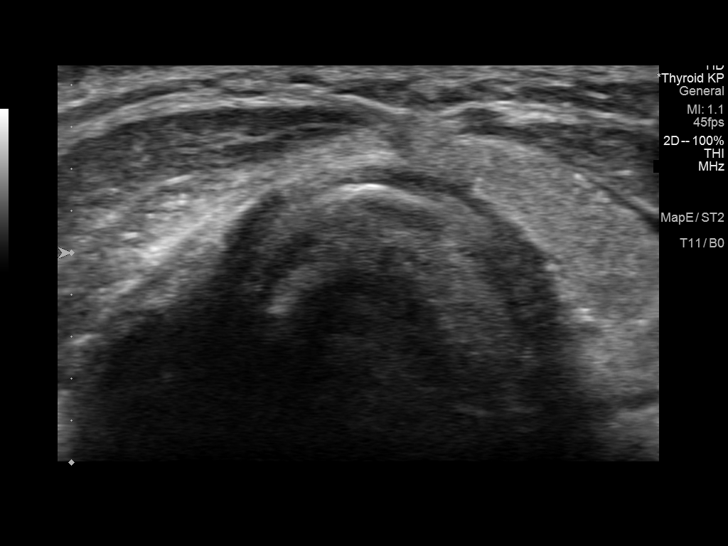
[im 4/38]
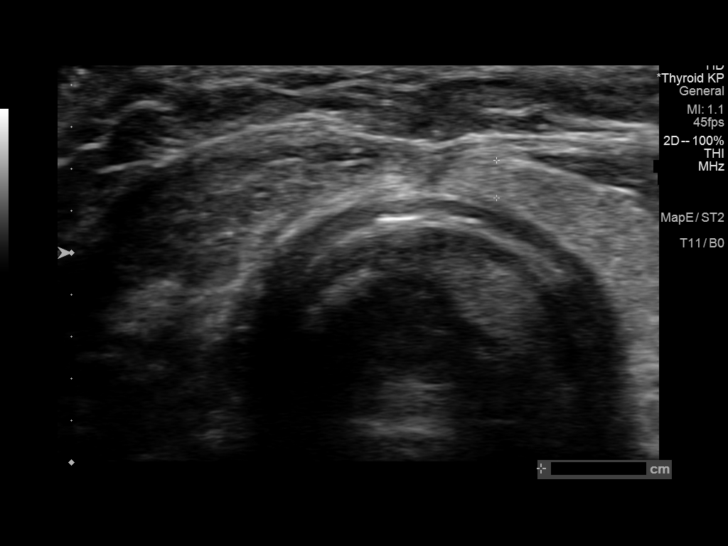
[im 7/38]
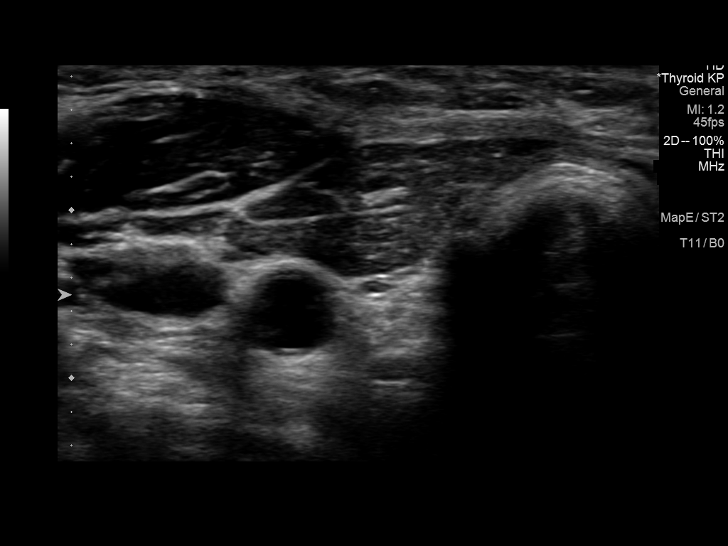
[im 10/38]
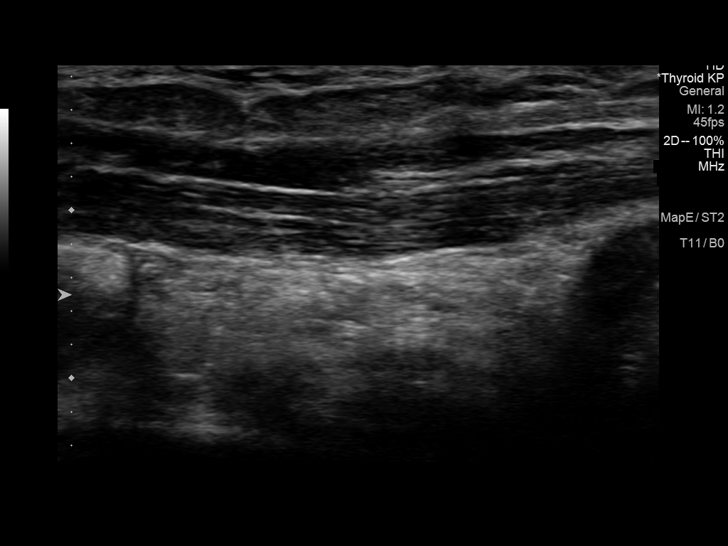
[im 13/38]
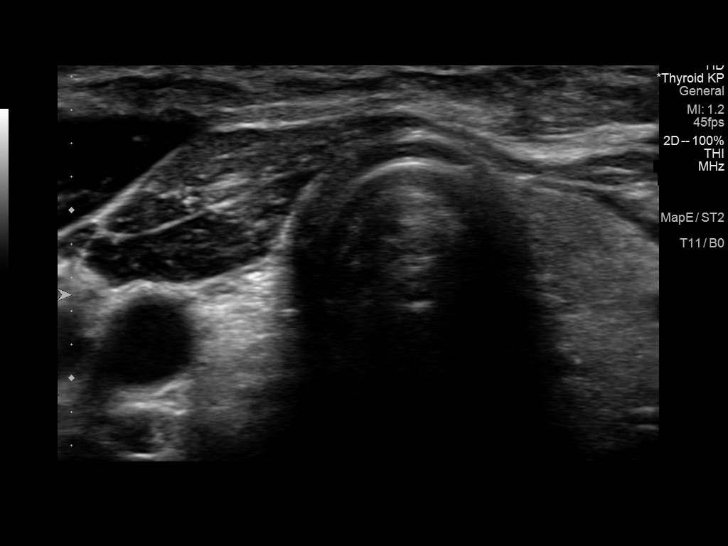
[im 16/38]
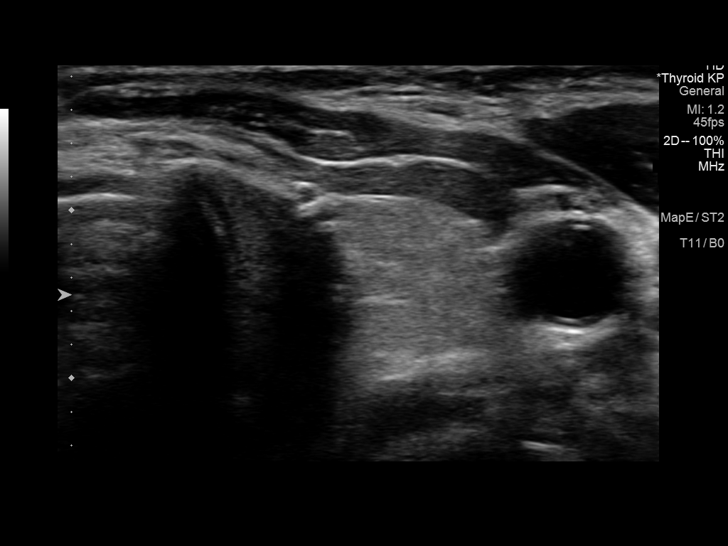
[im 19/38]
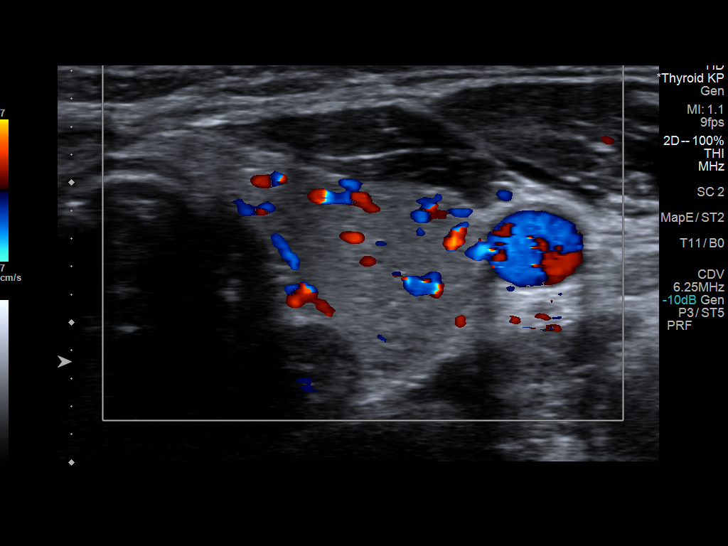
[im 22/38]
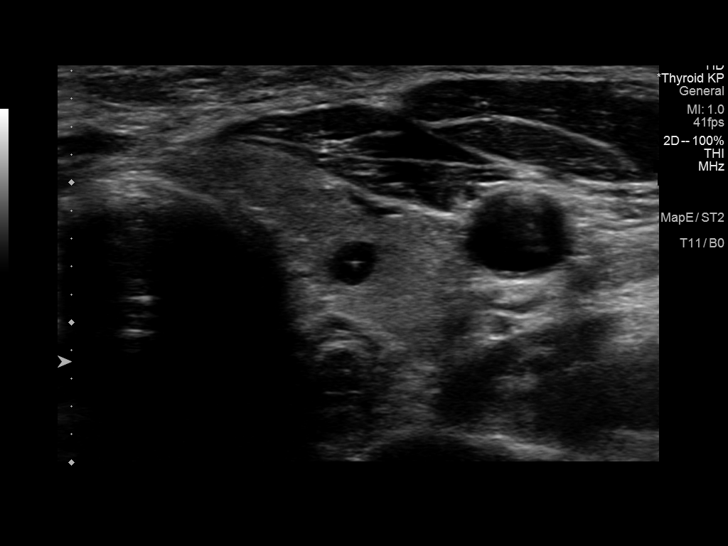
[im 25/38]
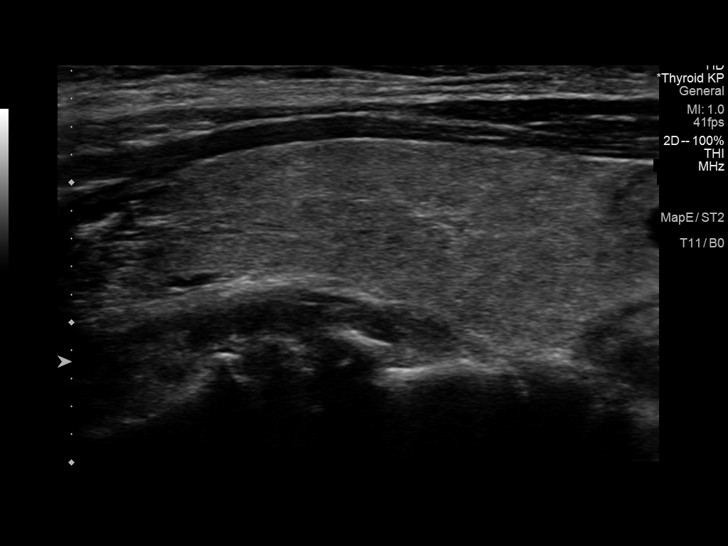
[im 28/38]
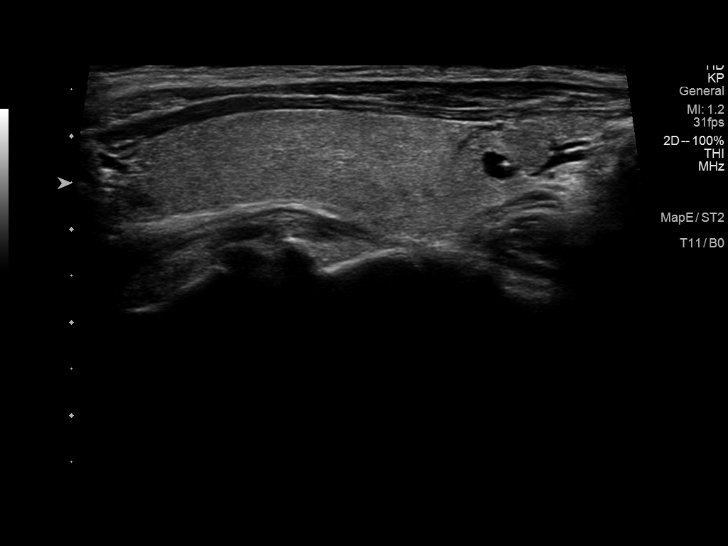
[im 31/38]
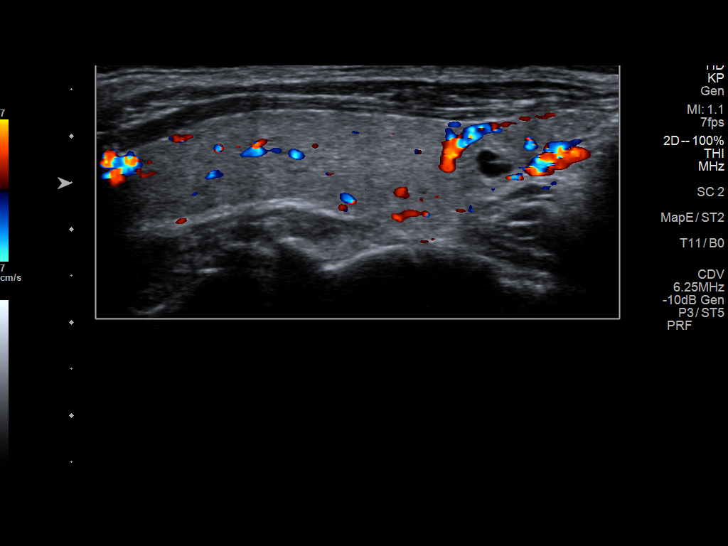
[im 34/38]
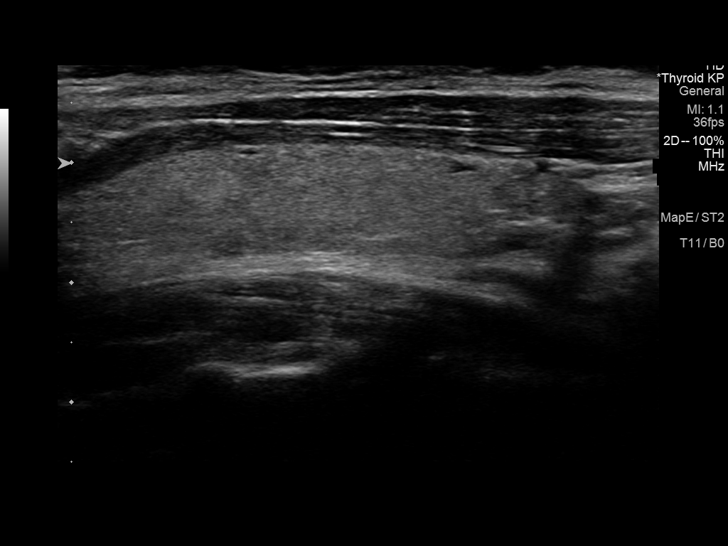
[im 38/38]
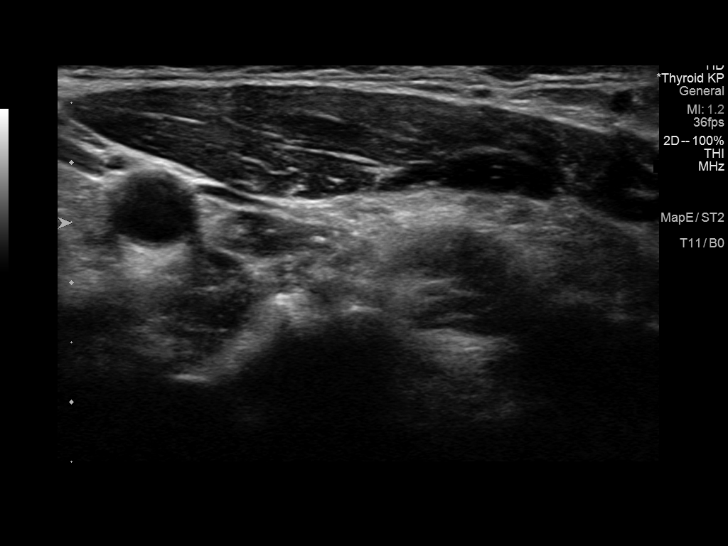

[13 of 25 positions shown; findings below may reference images not displayed]

FINDINGS: Parenchymal Echotexture: Mildly heterogenous

Isthmus: 0.2 cm

Right lobe: Surgically absent

Left lobe: 5.2 x 1.3 x 1.6 cm

_________________________________________________________

Estimated total number of nodules >/= 1 cm: 0

Number of spongiform nodules >/=  2 cm not described below (TR1): 0

Number of mixed cystic and solid nodules >/= 1.5 cm not described
below (TR2): 0

_________________________________________________________

No discrete nodules of consequence are seen within the thyroid
gland. Incidental note is made of a small anechoic cystic structure
with peripheral colloid artifact consistent with a colloid cyst in
the lower pole of the left gland. This is considered
incidental/benign and warrants no further evaluation.
IMPRESSION: Surgical changes of prior right hemithyroidectomy without evidence
of residual thyroid tissue, nodularity or lymphadenopathy.

No thyroid nodules of consequence within the left gland.

The above is in keeping with the ACR TI-RADS recommendations - [HOSPITAL] 5805;[DATE].

## 2023-04-05 DIAGNOSIS — Z1331 Encounter for screening for depression: Secondary | ICD-10-CM | POA: Diagnosis not present

## 2023-04-05 DIAGNOSIS — Z0000 Encounter for general adult medical examination without abnormal findings: Secondary | ICD-10-CM | POA: Diagnosis not present

## 2023-04-05 DIAGNOSIS — Z1231 Encounter for screening mammogram for malignant neoplasm of breast: Secondary | ICD-10-CM | POA: Diagnosis not present

## 2023-04-05 DIAGNOSIS — Z01419 Encounter for gynecological examination (general) (routine) without abnormal findings: Secondary | ICD-10-CM | POA: Diagnosis not present

## 2023-06-06 DIAGNOSIS — Z1211 Encounter for screening for malignant neoplasm of colon: Secondary | ICD-10-CM | POA: Diagnosis not present

## 2023-06-06 DIAGNOSIS — Z1212 Encounter for screening for malignant neoplasm of rectum: Secondary | ICD-10-CM | POA: Diagnosis not present

## 2023-06-13 LAB — EXTERNAL GENERIC LAB PROCEDURE: COLOGUARD: NEGATIVE

## 2023-06-13 LAB — COLOGUARD: COLOGUARD: NEGATIVE

## 2023-09-06 ENCOUNTER — Encounter: Payer: Self-pay | Admitting: Internal Medicine

## 2023-09-06 ENCOUNTER — Ambulatory Visit (INDEPENDENT_AMBULATORY_CARE_PROVIDER_SITE_OTHER): Payer: BC Managed Care – PPO | Admitting: Internal Medicine

## 2023-09-06 VITALS — BP 130/70 | HR 77 | Ht 65.0 in | Wt 187.2 lb

## 2023-09-06 DIAGNOSIS — Z9889 Other specified postprocedural states: Secondary | ICD-10-CM | POA: Diagnosis not present

## 2023-09-06 DIAGNOSIS — C73 Malignant neoplasm of thyroid gland: Secondary | ICD-10-CM

## 2023-09-06 NOTE — Patient Instructions (Signed)
 Please stop at the lab.  Please come back for a follow-up appointment in 1 year.

## 2023-09-06 NOTE — Progress Notes (Signed)
 Patient ID: Kelly Osborne, female   DOB: [birth year], 48 y.o.   MRN: [id number]   HPI  Kelly Osborne is a 48 y.o.-year-old female, initially referred by her OB/GYN provider, Dr. Arlana Lindau, NP, returning for follow-up for goiter and thyroid nodule.  Last visit 1 year ago.  Interim history: She feels well after surgery, without complaints.  She denies fatigue, constipation, cold intolerance, hair loss.  Before last visit she gained 11 pounds.  Weight is stable now.  Reviewed history: Patient was found to have a goiter during evaluation by GYN.  She was sent to have a thyroid ultrasound in 07/2018.  This showed a normal-sized thyroid with the left lobe slightly larger than the right, containing a 1.2 cm thyroid nodule.  At that time, it was unclear whether this was a true nodule versus a pseudonodule (inflammatory site).  Thyroid U/S (07/26/2018): Normal-sized thyroid with 1 nodule versus pseudo-nodule of 1.2 cm:  Parenchymal Echotexture: Mildly heterogenous Isthmus: 0.4 cm Right lobe: 4.9 x 1.5 x 1.9 cm Left lobe: 5.2 x 1.5 x 1.7 cm _________________________________________________________   Nodule # 1: Location: Right; Inferior Maximum size: 1.2 cm; Other 2 dimensions: 0.5 x 0.9 cm Composition: solid/almost completely solid (2) Echogenic foci: macrocalcifications (1) *Given size (>/= 1 - 1.4 cm) and appearance, a follow-up ultrasound in 1 year should be considered based on TI-RADS criteria. _________________________________________________  IMPRESSION: 1. Mildly heterogeneous and enlarged thyroid gland. 2. A 1.2 cm TI-RADS category 4 (moderately suspicious) nodule in the medial aspect of the right inferior gland meets criteria for follow-up ultrasound at 1 year. Of note, on some of the images, this "nodule" appears more like a posterior lobulation of the normal thyroid tissue that happens to have an adjacent dystrophic calcification. This may actually represent a  pseudonodule.     Thyroid U/S (08/20/2019): Parenchymal Echotexture: Normal Isthmus: 2 mm Right lobe: 4.8 x 1.1 x 2.0 cm Left lobe: 5.4 x 1.5 x 1.4 cm _________________________________________________________   Nodule # 1: Location: Right; Mid Maximum size: 1.4, previously 1.2 cm; Other 2 dimensions: 0.9 x 0.4 cm Composition: solid/almost completely solid (2) Echogenicity: hypoechoic (2) Echogenic foci: macrocalcifications (1) *Given size (>/= 1 - 1.4 cm) and appearance, a follow-up ultrasound in 1 year should be considered based on TI-RADS criteria.  ______________________________________________________   There are a few scattered bilateral subcentimeter thyroid cystic nodules all measuring 5 mm or less in size.   Normal thyroid echogenicity. No hypervascularity or regional adenopathy.   IMPRESSION: 1.4 cm right mid thyroid TR 4 nodule meets criteria for follow-up in 1 year.     Thyroid U/S (08/18/2020): Parenchymal Echotexture: Normal  Isthmus: 0.3 cm Right lobe: 5 x 1.3 x 1.8 cm Left lobe: 4.2 x 1.4 x 1.7 cm ____________________________________________________________   Nodule # 1: Prior biopsy: No  Location: Right; Mid Maximum size: 1.6 cm; Other 2 dimensions: 1 x 0.6 cm, previously, 1.4 x 0.9 x 0.4 cm Composition: solid/almost completely solid (2) Echogenicity: hypoechoic (2) Echogenic foci: macrocalcifications (1)  **Given size (>/= 1.5 cm) and appearance, fine needle aspiration of this moderately suspicious nodule should be considered based on TI-RADS criteria.  _________________________________________________________   IMPRESSION: Relatively stable previously demonstrated thyroid nodule in the right mid thyroid gland. While this thyroid nodule technically now meets criteria for fine-needle aspiration, a 1 year follow-up ultrasound is likely appropriate given its minimal interval change in size and appearance.    FNA (09/08/2020): AUS, Afirma:  Suspicious  Right thyroidectomy (11/09/2020) by Dr.  Gerkin.   A. THYROID, RIGHT, LOBECTOMY:  - Multifocal papillary thyroid carcinoma, follicular variant.  - Margins not involved.  - No extrathyroidal extension.  - See oncology table.   ONCOLOGY TABLE:  THYROID GLAND, CARCINOMA: Resection  Procedure: Right thyroid lobectomy.  Tumor Focality: Multifocal.  Tumor Site: Right thyroid lobe.  Tumor Size: 0.4 (calcified nodule), 0.2 and 0.1 cm.  Histologic Type: Follicular variant of papillary carcinoma.  Angioinvasion: Not identified.  Lymphatic Invasion: Not identified.  Extrathyroidal Extension: Not identified.  Margin Status: All margins negative for carcinoma.  Regional Lymph Node Status: No lymph nodes submitted.  Pathologic Stage Classification (pTNM, AJCC 8th Edition): pT1a, pN Not assigned.  .  Comment(s): There is a 0.4 cm partially calcified nodule with features  of follicular variant of papillary thyroid carcinoma.  In addition,  there are 2 smaller nodules in the right lobe which are 0.2 and 0.1 cm  with features consistent with follicular variant of papillary carcinoma.   Neck U/S (08/18/2021): No nodules in the left lobe, only a small anechoic cyst, not worrisome  Neck U/S (08/09/2022):  4 mm cystic nodule in the inferior left thyroid lobe does not meet criteria for imaging surveillance or FNA. The isthmus and left thyroid lobe are otherwise unremarkable.   IMPRESSION: 1. No abnormality of the right thyroidectomy bed. 2. No significant sonographic abnormality of the isthmus or left thyroid lobe.  Reviewed her TFTs: Lab Results  Component Value Date   TSH 2.62 09/06/2022   TSH 2.74 09/01/2021   TSH 2.90 12/09/2020   TSH 1.46 08/27/2020   FREET4 0.70 09/06/2022   FREET4 0.74 09/01/2021   FREET4 0.87 12/09/2020  05/25/2021: TSH 2.02 06/29/2018: TSH 1.170, free T4 0.99 09/07/2016: TSH 1.170, free T4 1.22 08/11/2008: TSH 1.6   Pt denies: - hoarseness -  dysphagia - choking  + FH of thyroid ds.- MGM with hypothyroidism. No FH of thyroid cancer. No h/o radiation tx to head or neck. No herbal supplements. No Biotin use. No recent steroids use.   She was on essential oils, vitamin D and Elderberry Gummies at last visit >> now off.  She had a partial hysterectomy 01/2017, after a uterine ablation was unsuccessful for management of uterine fibroids. She has a history of a heart murmur, gestational diabetes  ROS:+ see HPI  I reviewed pt's medications, allergies, PMH, social hx, family hx, and changes were documented in the history of present illness. Otherwise, unchanged from my initial visit note.  Past Medical History:  Diagnosis Date   Heart murmur    Past Surgical History:  Procedure Laterality Date   DILATATION & CURETTAGE/HYSTEROSCOPY WITH MYOSURE N/A 07/21/2016   Procedure: DILATATION & CURETTAGE/HYSTEROSCOPY WITH MYOSURE;  Surgeon: Maxie Better, MD;  Location: [facility];  Service: Gynecology;  Laterality: N/A;   HYSTEROSCOPY N/A 07/21/2016   Procedure: HYSTEROSCOPY WITH HYDROTHERMAL ABLATION;  Surgeon: Maxie Better, MD;  Location: [facility];  Service: Gynecology;  Laterality: N/A;   LAPAROSCOPIC VAGINAL HYSTERECTOMY WITH SALPINGECTOMY Bilateral 01/12/2017   Procedure: LAPAROSCOPIC ASSISTED VAGINAL HYSTERECTOMY WITH SALPINGECTOMY;  Surgeon: Maxie Better, MD;  Location: [facility];  Service: Gynecology;  Laterality: Bilateral;   MYRINGOTOMY     THYROID LOBECTOMY Right 11/09/2020   Procedure: RIGHT THYROID LOBECTOMY;  Surgeon: Darnell Level, MD;  Location: [facility];  Service: General;  Laterality: Right;   WISDOM TOOTH EXTRACTION     Social History   Socioeconomic History   Marital status: Married    Spouse name: Not on file  Number of children: 2   Years of education: Not on file   Highest education level: Not on file  Occupational History    CPA, Clinical cytogeneticist   Smoking status: Never Smoker   Smokeless  tobacco: Never Used  Substance and Sexual Activity   Alcohol use: Yes    Comment: rare   Drug use: No   She is not taking any prescription medications.  Allergies  Allergen Reactions   Sulfa Antibiotics Rash   Family history: Diabetes mellitus in great grandmothers, great aunts and uncles HTN in grandfather, great aunt hyperlipidemia and grandmother Heart disease in grandfather and great-grandmother, heart murmur in mother and brother Lung and liver cancer in grandfather Thyroid disease in the grandmother  PE: BP 130/70   Pulse 77   Ht 5\' 5"  (1.651 m)   Wt 187 lb 3.2 oz (84.9 kg)   LMP 12/23/2016 (Exact Date)   SpO2 95%   BMI 31.15 kg/m  Wt Readings from Last 3 Encounters:  09/06/23 187 lb 3.2 oz (84.9 kg)  09/06/22 184 lb 9.6 oz (83.7 kg)  09/01/21 173 lb 9.6 oz (78.7 kg)   Constitutional: overweight, in NAD Eyes: EOMI, no exophthalmos ENT:  surgical scar healed, without  keloid, no cervical lymphadenopathy Cardiovascular: RRR, No MRG Respiratory: CTA B Musculoskeletal: no deformities Skin: no rashes Neurological: no tremor with outstretched hands  ASSESSMENT: 1.  Follicular variant of papillary thyroid cancer  2.  Status post right thyroidectomy  PLAN: 1.  Follicular variant of PTC -Patient has a history of a right thyroid nodule observed in the right inferior lobe, initially, measuring 1.2 cm on the first ultrasound in 07/2018, but which increased in size to 1.6 cm on her ultrasound from 08/2020.  At that time, due to the increase in size, the nodule met criteria for biopsy.  An FNA returned inconclusive, atypia of undetermined significance, however, Afirma molecular marker returned suspicious.  I referred her to Dr. Gerrit Friends and she had right thyroidectomy on 11/09/2020.  Final pathology showed multifocal subcentimeter follicular variant of papillary thyroid cancer without extrathyroidal or lymphovascular extension.  Margins were not involved. At that time, I did not  suggest completion thyroidectomy, as the left thyroid lobe appeared to not contain any nodules.  Therefore, she did not have RAI treatment. -We checked another thyroid ultrasound 08/2022 after she contacted me with feeling something stuck in her throat when she was swallowing, and this showed no nodules in the left lobe aside a small anechoic cyst and no suspicious masses in the right thyroid bed.  The swallowing problem could have been related to small amount of scar tissue.  This resolved. -No invention needed for now we will plan to repeat another ultrasound in 2 years from the previous, at next visit  2. S/p right thyroid lobectomy -Fortunately, no need for levothyroxine -Latest TFTs were reviewed from 09/06/2022 and these were all normal -Before last visit, she gained 11 pounds but did not complain of hypothyroid symptoms otherwise.  At today's visit, she again denies hypothyroid symptoms.  Weight is approximately stable (+3 pounds, but she has heavy boots on). -Will continue to follow her clinically and biochemically -We will recheck her TFTs today -We discussed about how to take levothyroxine correctly, in case we need to start: Daily, fasting, separated by breakfast and supplements  Orders Placed This Encounter  Procedures   TSH   T4, free   T3, free   Carlus Pavlov, MD PhD [facility] Endocrinology

## 2023-09-07 ENCOUNTER — Encounter: Payer: Self-pay | Admitting: Internal Medicine

## 2023-09-07 LAB — T4, FREE: Free T4: 1 ng/dL (ref 0.8–1.8)

## 2023-09-07 LAB — T3, FREE: T3, Free: 3.2 pg/mL (ref 2.3–4.2)

## 2023-09-07 LAB — TSH: TSH: 2.14 m[IU]/L

## 2023-09-28 DIAGNOSIS — S63591A Other specified sprain of right wrist, initial encounter: Secondary | ICD-10-CM | POA: Diagnosis not present

## 2023-09-28 DIAGNOSIS — M25531 Pain in right wrist: Secondary | ICD-10-CM | POA: Diagnosis not present

## 2023-10-03 DIAGNOSIS — E7800 Pure hypercholesterolemia, unspecified: Secondary | ICD-10-CM | POA: Diagnosis not present

## 2023-10-03 DIAGNOSIS — B349 Viral infection, unspecified: Secondary | ICD-10-CM | POA: Diagnosis not present

## 2023-10-03 DIAGNOSIS — R7303 Prediabetes: Secondary | ICD-10-CM | POA: Diagnosis not present

## 2023-10-09 DIAGNOSIS — J0100 Acute maxillary sinusitis, unspecified: Secondary | ICD-10-CM | POA: Diagnosis not present

## 2024-01-15 ENCOUNTER — Other Ambulatory Visit: Payer: Self-pay

## 2024-01-15 ENCOUNTER — Encounter (HOSPITAL_BASED_OUTPATIENT_CLINIC_OR_DEPARTMENT_OTHER): Payer: Self-pay

## 2024-01-15 ENCOUNTER — Emergency Department (HOSPITAL_BASED_OUTPATIENT_CLINIC_OR_DEPARTMENT_OTHER): Admission: EM | Admit: 2024-01-15 | Discharge: 2024-01-15 | Disposition: A | Discharge status: Home / Self Care

## 2024-01-15 ENCOUNTER — Emergency Department (HOSPITAL_BASED_OUTPATIENT_CLINIC_OR_DEPARTMENT_OTHER): Admitting: Radiology

## 2024-01-15 DIAGNOSIS — R079 Chest pain, unspecified: Secondary | ICD-10-CM | POA: Diagnosis present

## 2024-01-15 DIAGNOSIS — R0789 Other chest pain: Secondary | ICD-10-CM | POA: Insufficient documentation

## 2024-01-15 DIAGNOSIS — R072 Precordial pain: Secondary | ICD-10-CM | POA: Diagnosis not present

## 2024-01-15 LAB — BASIC METABOLIC PANEL WITH GFR
Anion gap: 14 (ref 5–15)
BUN: 10 mg/dL (ref 6–20)
CO2: 24 mmol/L (ref 22–32)
Calcium: 9.7 mg/dL (ref 8.9–10.3)
Chloride: 102 mmol/L (ref 98–111)
Creatinine, Ser: 0.88 mg/dL (ref 0.44–1.00)
GFR, Estimated: >60 mL/min (ref >60)
Glucose, Bld: 103 mg/dL — ABNORMAL HIGH (ref 70–99)
Potassium: 3.8 mmol/L (ref 3.5–5.1)
Sodium: 140 mmol/L (ref 135–145)

## 2024-01-15 LAB — CBC
HCT: 40.3 % (ref 36.0–46.0)
Hemoglobin: 13.1 g/dL (ref 12.0–15.0)
MCH: 29.2 pg (ref 26.0–34.0)
MCHC: 32.5 g/dL (ref 30.0–36.0)
MCV: 90 fL (ref 80.0–100.0)
Platelets: 338 K/uL (ref 150–400)
RBC: 4.48 MIL/uL (ref 3.87–5.11)
RDW: 13.3 % (ref 11.5–15.5)
WBC: 8.9 K/uL (ref 4.0–10.5)
nRBC: 0 % (ref 0.0–0.2)

## 2024-01-15 LAB — TROPONIN T, HIGH SENSITIVITY
Troponin T High Sensitivity: <15 ng/L (ref <19)
Troponin T High Sensitivity: <15 ng/L (ref <19)

## 2024-01-15 MED ORDER — ALUM & MAG HYDROXIDE-SIMETH 200-200-20 MG/5ML PO SUSP
30.0000 mL | Freq: Once | ORAL | Status: AC
  Administered 2024-01-15: 30 mL via ORAL
  Filled 2024-01-15: qty 30

## 2024-01-15 NOTE — ED Provider Notes (Signed)
 [place] EMERGENCY DEPARTMENT AT [facility] Provider Note   CSN: [id number] Arrival date & time: 01/15/24  8077     Patient presents with: Chest Pain   Kelly Osborne is a 48 y.o. female.   48 year old female presenting emergency department for chest pain.  Central chest.  Described as pressure.  Intermittent lasting for several seconds.  No associated symptoms.  No nausea or vomiting.  No shortness of breath.  No history of cardiac disease.  Low risk for PE based on Wells criteria.   Chest Pain      Prior to Admission medications   Not on File    Allergies: Sulfa antibiotics    Review of Systems  Cardiovascular:  Positive for chest pain.    Updated Vital Signs BP 127/77   Pulse 73   Temp 98.2 F (36.8 C) (Oral)   Resp 16   Ht 5' 4 (1.626 m)   Wt 83.5 kg   LMP 12/23/2016 (Exact Date)   SpO2 100%   BMI 31.58 kg/m   Physical Exam Vitals and nursing note reviewed.  Constitutional:      General: She is not in acute distress.    Appearance: She is not toxic-appearing.  Cardiovascular:     Rate and Rhythm: Normal rate.     Pulses:          Radial pulses are 2+ on the right side and 2+ on the left side.     Heart sounds: Normal heart sounds.  Pulmonary:     Effort: Pulmonary effort is normal.     Breath sounds: Normal breath sounds. No wheezing, rhonchi or rales.  Chest:     Chest wall: No tenderness.  Abdominal:     Palpations: Abdomen is soft.     Tenderness: There is no abdominal tenderness.  Musculoskeletal:        General: Normal range of motion.     Cervical back: Normal range of motion.     Right lower leg: No edema.     Left lower leg: No edema.  Skin:    General: Skin is warm and dry.     Capillary Refill: Capillary refill takes less than 2 seconds.  Neurological:     Mental Status: She is oriented to person, place, and time.  Psychiatric:        Mood and Affect: Mood normal.        Behavior: Behavior normal.     (all labs  ordered are listed, but only abnormal results are displayed) Labs Reviewed  BASIC METABOLIC PANEL WITH GFR - Abnormal; Notable for the following components:      Result Value   Glucose, Bld 103 (*)    All other components within normal limits  CBC  TROPONIN T, HIGH SENSITIVITY  TROPONIN T, HIGH SENSITIVITY    EKG: EKG Interpretation Date/Time:  Monday January 15 2024 19:30:14 EDT Ventricular Rate:  81 PR Interval:  137 QRS Duration:  105 QT Interval:  372 QTC Calculation: 432 R Axis:   69  Text Interpretation: Sinus rhythm Borderline T abnormalities, anterior leads Confirmed by Neysa Clap [phone]) on 01/15/2024 9:48:04 PM  Radiology: [facility] Chest 2 View Result Date: 01/15/2024 CLINICAL DATA:  Chest pain EXAM: CHEST - 2 VIEW COMPARISON:  10/30/2020 FINDINGS: The heart size and mediastinal contours are within normal limits. Both lungs are clear. The visualized skeletal structures are unremarkable. IMPRESSION: No active cardiopulmonary disease. Electronically Signed   By: Oneil Evelyne HERO.D.  On: 01/15/2024 19:56     Procedures   Medications Ordered in the ED  alum & mag hydroxide-simeth (MAALOX/MYLANTA) 200-200-20 MG/5ML suspension 30 mL (has no administration in time range)                                    Medical Decision Making Is a 48 year old female presenting emergency department for chest pain.  She is afebrile nontachycardic, hemodynamically stable.  Maintaining oxygen saturation on room air.  EKG appears to be normal sinus rhythm without ST segment changes to indicate ischemia.  Troponins negative x 2.  ACS unlikely.  Low risk for PE based on Wells criteria, PERC negative.  No leukocytosis fever or tachycardia to suggest systemic infection.  Chest x-ray without pneumonia or pneumothorax on my independent interpretation.  Symptoms intermittent, query GERD/reflux.  Given GI cocktail.    Amount and/or Complexity of Data Reviewed Independent Historian: spouse    Details:  Spouse is concerned about gallbladder disease, but patient denies abdominal pain, not having abdominal tenderness. External Data Reviewed:     Details: Does have history of heart murmur Labs: ordered. Radiology: ordered and independent interpretation performed. ECG/medicine tests: independent interpretation performed.  Risk OTC drugs. Decision regarding hospitalization. Diagnosis or treatment significantly limited by social determinants of health.       Final diagnoses:  None    ED Discharge Orders     None          Neysa Caron PARAS, DO 01/15/24 2225

## 2024-01-15 NOTE — Discharge Instructions (Signed)
 Please follow-up with your primary doctor.  Return immediately felt fevers, chills, lightheadedness, palpitations, passout, worsening chest pain, shortness of breath, severe pain or any new or worsening symptoms that are concerning to you.

## 2024-01-15 NOTE — ED Triage Notes (Signed)
 Pt presents via POV c/o midsternal chest discomfort. Reports pain started last night. Denies SOB. Reports some weakness. A&O x4. Ambulatory to triage.

## 2024-02-12 ENCOUNTER — Encounter: Payer: Self-pay | Admitting: Internal Medicine

## 2024-02-12 ENCOUNTER — Ambulatory Visit (INDEPENDENT_AMBULATORY_CARE_PROVIDER_SITE_OTHER): Admitting: Internal Medicine

## 2024-02-12 VITALS — BP 120/70 | HR 83 | Ht 64.0 in | Wt 186.8 lb

## 2024-02-12 DIAGNOSIS — R1310 Dysphagia, unspecified: Secondary | ICD-10-CM | POA: Diagnosis not present

## 2024-02-12 DIAGNOSIS — C73 Malignant neoplasm of thyroid gland: Secondary | ICD-10-CM

## 2024-02-12 DIAGNOSIS — Z9889 Other specified postprocedural states: Secondary | ICD-10-CM

## 2024-02-12 LAB — T3, FREE: T3, Free: 3.3 pg/mL (ref 2.3–4.2)

## 2024-02-12 LAB — T4, FREE: Free T4: 1.2 ng/dL (ref 0.8–1.8)

## 2024-02-12 LAB — TSH: TSH: 2.18 m[IU]/L

## 2024-02-12 NOTE — Progress Notes (Addendum)
 Patient ID: Kelly Osborne, female   DOB: [birth year], 48 y.o.   MRN: [id number]   HPI  Kelly Osborne is a 48 y.o.-year-old female, initially referred by her OB/GYN provider, Dr. Mliss Perry, NP, returning for follow-up for goiter and thyroid  nodule.  Last visit 5 months ago.  Interim history: Patient scheduled this appointment earlier due to relatively recent onset of fatigue, more hair loss, constipation, and also some swelling around her ankles.  She is wondering if the symptoms would be related to hypothyroidism. Last month she presented to the emergency room with chest pain, which was attributed to acid reflux.  She was started on omeprazole - she finishes a 2-week course tomorrow.   Reviewed history: Patient was found to have a goiter during evaluation by GYN.  She was sent to have a thyroid  ultrasound in 07/2018.  This showed a normal-sized thyroid  with the left lobe slightly larger than the right, containing a 1.2 cm thyroid  nodule.  At that time, it was unclear whether this was a true nodule versus a pseudonodule (inflammatory site).  Thyroid  U/S (07/26/2018): Normal-sized thyroid  with 1 nodule versus pseudo-nodule of 1.2 cm:  Parenchymal Echotexture: Mildly heterogenous Isthmus: 0.4 cm Right lobe: 4.9 x 1.5 x 1.9 cm Left lobe: 5.2 x 1.5 x 1.7 cm _________________________________________________________   Nodule # 1: Location: Right; Inferior Maximum size: 1.2 cm; Other 2 dimensions: 0.5 x 0.9 cm Composition: solid/almost completely solid (2) Echogenic foci: macrocalcifications (1) *Given size (>/= 1 - 1.4 cm) and appearance, a follow-up ultrasound in 1 year should be considered based on TI-RADS criteria. _________________________________________________  IMPRESSION: 1. Mildly heterogeneous and enlarged thyroid  gland. 2. A 1.2 cm TI-RADS category 4 (moderately suspicious) nodule in the medial aspect of the right inferior gland meets criteria for follow-up ultrasound at 1  year. Of note, on some of the images, this nodule appears more like a posterior lobulation of the normal thyroid  tissue that happens to have an adjacent dystrophic calcification. This may actually represent a pseudonodule.     Thyroid  U/S (08/20/2019): Parenchymal Echotexture: Normal Isthmus: 2 mm Right lobe: 4.8 x 1.1 x 2.0 cm Left lobe: 5.4 x 1.5 x 1.4 cm _________________________________________________________   Nodule # 1: Location: Right; Mid Maximum size: 1.4, previously 1.2 cm; Other 2 dimensions: 0.9 x 0.4 cm Composition: solid/almost completely solid (2) Echogenicity: hypoechoic (2) Echogenic foci: macrocalcifications (1) *Given size (>/= 1 - 1.4 cm) and appearance, a follow-up ultrasound in 1 year should be considered based on TI-RADS criteria.  ______________________________________________________   There are a few scattered bilateral subcentimeter thyroid  cystic nodules all measuring 5 mm or less in size.   Normal thyroid  echogenicity. No hypervascularity or regional adenopathy.   IMPRESSION: 1.4 cm right mid thyroid  TR 4 nodule meets criteria for follow-up in 1 year.     Thyroid  U/S (08/18/2020): Parenchymal Echotexture: Normal  Isthmus: 0.3 cm Right lobe: 5 x 1.3 x 1.8 cm Left lobe: 4.2 x 1.4 x 1.7 cm ____________________________________________________________   Nodule # 1: Prior biopsy: No  Location: Right; Mid Maximum size: 1.6 cm; Other 2 dimensions: 1 x 0.6 cm, previously, 1.4 x 0.9 x 0.4 cm Composition: solid/almost completely solid (2) Echogenicity: hypoechoic (2) Echogenic foci: macrocalcifications (1)  **Given size (>/= 1.5 cm) and appearance, fine needle aspiration of this moderately suspicious nodule should be considered based on TI-RADS criteria.  _________________________________________________________   IMPRESSION: Relatively stable previously demonstrated thyroid  nodule in the right mid thyroid  gland. While this thyroid  nodule  technically  now meets criteria for fine-needle aspiration, a 1 year follow-up ultrasound is likely appropriate given its minimal interval change in size and appearance.    FNA (09/08/2020): AUS, Afirma: Suspicious  Right thyroidectomy (11/09/2020) by Dr. Eletha.   A. THYROID , RIGHT, LOBECTOMY:  - Multifocal papillary thyroid  carcinoma, follicular variant.  - Margins not involved.  - No extrathyroidal extension.  - See oncology table.   ONCOLOGY TABLE:  THYROID  GLAND, CARCINOMA: Resection  Procedure: Right thyroid  lobectomy.  Tumor Focality: Multifocal.  Tumor Site: Right thyroid  lobe.  Tumor Size: 0.4 (calcified nodule), 0.2 and 0.1 cm.  Histologic Type: Follicular variant of papillary carcinoma.  Angioinvasion: Not identified.  Lymphatic Invasion: Not identified.  Extrathyroidal Extension: Not identified.  Margin Status: All margins negative for carcinoma.  Regional Lymph Node Status: No lymph nodes submitted.  Pathologic Stage Classification (pTNM, AJCC 8th Edition): pT1a, pN Not assigned.  .  Comment(s): There is a 0.4 cm partially calcified nodule with features  of follicular variant of papillary thyroid  carcinoma.  In addition,  there are 2 smaller nodules in the right lobe which are 0.2 and 0.1 cm  with features consistent with follicular variant of papillary carcinoma.   Neck U/S (08/18/2021): No nodules in the left lobe, only a small anechoic cyst, not worrisome  Neck U/S (08/09/2022):  4 mm cystic nodule in the inferior left thyroid  lobe does not meet criteria for imaging surveillance or FNA. The isthmus and left thyroid  lobe are otherwise unremarkable.   IMPRESSION: 1. No abnormality of the right thyroidectomy bed. 2. No significant sonographic abnormality of the isthmus or left thyroid  lobe.  Reviewed her TFTs: Lab Results  Component Value Date   TSH 2.14 09/06/2023   TSH 2.62 09/06/2022   TSH 2.74 09/01/2021   TSH 2.90 12/09/2020   TSH 1.46 08/27/2020    FREET4 1.0 09/06/2023   FREET4 0.70 09/06/2022   FREET4 0.74 09/01/2021   FREET4 0.87 12/09/2020  05/25/2021: TSH 2.02 06/29/2018: TSH 1.170, free T4 0.99 09/07/2016: TSH 1.170, free T4 1.22 08/11/2008: TSH 1.6   Pt denies: - hoarseness - dysphagia - choking  + FH of thyroid  ds.- MGM with hypothyroidism. No FH of thyroid  cancer. No h/o radiation tx to head or neck. No herbal supplements. No Biotin use. No recent steroids use.   She was on essential oils, vitamin D and Elderberry Gummies at last visit >> now off.  She had a partial hysterectomy 01/2017, after a uterine ablation was unsuccessful for management of uterine fibroids. She has a history of a heart murmur, gestational diabetes  ROS:+ see HPI  I reviewed pt's medications, allergies, PMH, social hx, family hx, and changes were documented in the history of present illness. Otherwise, unchanged from my initial visit note.  Past Medical History:  Diagnosis Date   Heart murmur    Past Surgical History:  Procedure Laterality Date   DILATATION & CURETTAGE/HYSTEROSCOPY WITH MYOSURE N/A 07/21/2016   Procedure: DILATATION & CURETTAGE/HYSTEROSCOPY WITH MYOSURE;  Surgeon: Dickie Carder, MD;  Location: [facility];  Service: Gynecology;  Laterality: N/A;   HYSTEROSCOPY N/A 07/21/2016   Procedure: HYSTEROSCOPY WITH HYDROTHERMAL ABLATION;  Surgeon: Dickie Carder, MD;  Location: [facility];  Service: Gynecology;  Laterality: N/A;   LAPAROSCOPIC VAGINAL HYSTERECTOMY WITH SALPINGECTOMY Bilateral 01/12/2017   Procedure: LAPAROSCOPIC ASSISTED VAGINAL HYSTERECTOMY WITH SALPINGECTOMY;  Surgeon: Carder Dickie, MD;  Location: [facility];  Service: Gynecology;  Laterality: Bilateral;   MYRINGOTOMY     THYROID  LOBECTOMY Right 11/09/2020   Procedure: RIGHT  THYROID LOBECTOMY;  Surgeon: Eletha Boas, MD;  Location: [facility];  Service: General;  Laterality: Right;   WISDOM TOOTH EXTRACTION     Social History   Socioeconomic History   Marital status:  Married    Spouse name: Not on file   Number of children: 2   Years of education: Not on file   Highest education level: Not on file  Occupational History    CPA, financial operation manager   Smoking status: Never Smoker   Smokeless tobacco: Never Used  Substance and Sexual Activity   Alcohol use: Yes    Comment: rare   Drug use: No   She is not taking any prescription medications.  Allergies  Allergen Reactions   Sulfa Antibiotics Rash   Family history: Diabetes mellitus in great grandmothers, great aunts and uncles HTN in grandfather, great aunt hyperlipidemia and grandmother Heart disease in grandfather and great-grandmother, heart murmur in mother and brother Lung and liver cancer in grandfather Thyroid disease in the grandmother  PE: BP 120/70   Pulse 83   Ht 5' 4 (1.626 m)   Wt 186 lb 12.8 oz (84.7 kg)   LMP 12/23/2016 (Exact Date)   SpO2 98%   BMI 32.06 kg/m  Wt Readings from Last 3 Encounters:  02/12/24 186 lb 12.8 oz (84.7 kg)  01/15/24 184 lb (83.5 kg)  09/06/23 187 lb 3.2 oz (84.9 kg)   Constitutional: overweight, in NAD Eyes: EOMI, no exophthalmos ENT:  surgical scar healed, without  keloid, no cervical masses or cervical lymphadenopathy Cardiovascular: RRR, No MRG, + B periankle mild swelling Respiratory: CTA B Musculoskeletal: no deformities Skin: no rashes  ASSESSMENT: 1.  Follicular variant of papillary thyroid cancer  2.  Status post right thyroidectomy  PLAN: 1.  Follicular variant of PTC -Patient with a history of a right thyroid nodule observed in the right inferior lobe, initially measuring 1.2 cm on the first ultrasound in 07/2018 but increased in size to 1.6 cm on ultrasound from 2022.  At that time, due to the increasing size, the nodule met criteria for biopsy.  An FNA returned inconclusive, atypia of undetermined significance, however, Afirma molecular marker returned suspicious.  I referred her to Dr. Eletha and she had right  thyroidectomy in 11/2020.  Final pathology showed multifocal subcentimeter follicular variant of papillary thyroid cancer without extrathyroidal or lymphovascular extension.  Margins were not involved.  I did not suggest completion thyroidectomy as the left thyroid lobe appeared did not contain any nodules.  She did not have RAI treatment. - We checked a thyroid ultrasound in 08/2022 after she contacted me with feeling something stuck in her throat when she was swallowing.  This showed no nodules in the left lobe side a small anechoic cyst with no suspicious masses in the right thyroid bed, also.  The swallowing problem could have been related to small amount of scar tissue but this resolved.  We plan to repeat another ultrasound in 2 years from the previous -we will order this at next visit. - No intervention needed for now  - I will see her back in 7 months  2. S/p right thyroid lobectomy - fortunately, no need for levothyroxine - TFTs were reviewed from 09/2023 >> all normal - At last visit, she complained of weight gain, but no hypothyroid symptoms.  However, afterwards, she mentions that she started to develop fatigue, constipation, hair loss, and also peri-ankle swelling.  She is wondering whether these symptoms are related to hypothyroidism.  We discussed about these and other possible hypothyroid symptoms.  We also discussed that some of the symptoms could be related to perimenopause. - we will recheck her TFTs today - We discussed about how to take levothyroxine correctly, in case we need to start: Daily, fasting, separated by breakfast and supplements  Component     Latest Ref Rng 02/12/2024  TSH     mIU/L 2.18   Triiodothyronine,Free,Serum     2.3 - 4.2 pg/mL 3.3   T4,Free(Direct)     0.8 - 1.8 ng/dL 1.2   Normal TFTs.  Lela Fendt, MD PhD [facility]

## 2024-02-12 NOTE — Patient Instructions (Signed)
 Please stop at the lab.  If we need to start, take the thyroid  hormone every day, with water, at least 30 minutes before breakfast, separated by at least 4 hours from: - acid reflux medications - calcium - iron - multivitamins  Please come back for a follow-up appointment in 7 months.

## 2024-02-13 ENCOUNTER — Ambulatory Visit: Payer: Self-pay | Admitting: Internal Medicine

## 2024-04-08 ENCOUNTER — Encounter: Payer: Self-pay | Admitting: Cardiology

## 2024-04-08 ENCOUNTER — Ambulatory Visit

## 2024-04-08 ENCOUNTER — Ambulatory Visit: Attending: Cardiology | Admitting: Cardiology

## 2024-04-08 VITALS — BP 122/80 | HR 87 | Ht 64.0 in | Wt 189.0 lb

## 2024-04-08 DIAGNOSIS — R072 Precordial pain: Secondary | ICD-10-CM

## 2024-04-08 DIAGNOSIS — R079 Chest pain, unspecified: Secondary | ICD-10-CM | POA: Diagnosis not present

## 2024-04-08 DIAGNOSIS — R002 Palpitations: Secondary | ICD-10-CM

## 2024-04-08 DIAGNOSIS — R0609 Other forms of dyspnea: Secondary | ICD-10-CM

## 2024-04-08 DIAGNOSIS — R011 Cardiac murmur, unspecified: Secondary | ICD-10-CM | POA: Diagnosis not present

## 2024-04-08 MED ORDER — METOPROLOL TARTRATE 100 MG PO TABS: ORAL_TABLET | ORAL | 0 refills | Status: AC

## 2024-04-08 NOTE — Patient Instructions (Addendum)
 Medication Instructions:  Your physician recommends that you continue on your current medications as directed. Please refer to the Current Medication list given to you today.  *If you need a refill on your cardiac medications before your next appointment, please call your pharmacy*  Lab Work: Your physician recommends that you have labs drawn today: BMET & Lipids  If you have labs (blood work) drawn today and your tests are completely normal, you will receive your results only by: MyChart Message (if you have MyChart) OR A paper copy in the mail If you have any lab test that is abnormal or we need to change your treatment, we will call you to review the results.  Testing/Procedures: Your physician has requested that you have an echocardiogram. Echocardiography is a painless test that uses sound waves to create images of your heart. It provides your doctor with information about the size and shape of your heart and how well your heart's chambers and valves are working. This procedure takes approximately one hour. There are no restrictions for this procedure. Please do NOT wear cologne, perfume, aftershave, or lotions (deodorant is allowed). Please arrive 15 minutes prior to your appointment time.  Please note: We ask at that you not bring children with you during ultrasound (echo/ vascular) testing. Due to room size and safety concerns, children are not allowed in the ultrasound rooms during exams. Our front office staff cannot provide observation of children in our lobby area while testing is being conducted. An adult accompanying a patient to their appointment will only be allowed in the ultrasound room at the discretion of the ultrasound technician under special circumstances. We apologize for any inconvenience.   ZIO XT- Long Term Monitor Instructions  Your physician has requested you wear a ZIO patch monitor for 7 days.  This is a single patch monitor. [employer] supplies one patch monitor  per enrollment. Additional stickers are not available. Please do not apply patch if you will be having a Nuclear Stress Test,  Echocardiogram, Cardiac CT, MRI, or Chest Xray during the period you would be wearing the  monitor. The patch cannot be worn during these tests. You cannot remove and re-apply the  ZIO XT patch monitor.  Your ZIO patch monitor will be mailed 3 day USPS to your address on file. It may take 3-5 days  to receive your monitor after you have been enrolled.  Once you have received your monitor, please review the enclosed instructions. Your monitor  has already been registered assigning a specific monitor serial # to you.  Billing and Patient Assistance Program Information  We have supplied [employer] with any of your insurance information on file for billing purposes. [employer] offers a sliding scale Patient Assistance Program for patients that do not have  insurance, or whose insurance does not completely cover the cost of the ZIO monitor.  You must apply for the Patient Assistance Program to qualify for this discounted rate.  To apply, please call [employer] at [phone], select option 4, select option 2, ask to apply for  Patient Assistance Program. Meredeth will ask your household income, and how many people  are in your household. They will quote your out-of-pocket cost based on that information.  [employer] will also be able to set up a 95-month, interest-free payment plan if needed.  Applying the monitor   Shave hair from upper left chest.  Hold abrader disc by orange tab. Rub abrader in 40 strokes over the upper left chest as  indicated in  your monitor instructions.  Clean area with 4 enclosed alcohol pads. Let dry.  Apply patch as indicated in monitor instructions. Patch will be placed under collarbone on left  side of chest with arrow pointing upward.  Rub patch adhesive wings for 2 minutes. Remove white label marked 1. Remove the white  label marked 2. Rub patch  adhesive wings for 2 additional minutes.  While looking in a mirror, press and release button in center of patch. A small green light will  flash 3-4 times. This will be your only indicator that the monitor has been turned on.  Do not shower for the first 24 hours. You may shower after the first 24 hours.  Press the button if you feel a symptom. You will hear a small click. Record Date, Time and  Symptom in the Patient Logbook.  When you are ready to remove the patch, follow instructions on the last 2 pages of Patient  Logbook. Stick patch monitor onto the last page of Patient Logbook.  Place Patient Logbook in the blue and white box. Use locking tab on box and tape box closed  securely. The blue and white box has prepaid postage on it. Please place it in the mailbox as  soon as possible. Your physician should have your test results approximately 7 days after the  monitor has been mailed back to [facility].  Call [facility] Customer Care at [phone] if you have questions regarding  your ZIO XT patch monitor. Call them immediately if you see an orange light blinking on your  monitor.  If your monitor falls off in less than 4 days, contact our Monitor department at [phone].  If your monitor becomes loose or falls off after 4 days call [employer] at [phone] for  suggestions on securing your monitor   Follow-Up: At [facility], you and your health needs are our priority.  As part of our continuing mission to provide you with exceptional heart care, our providers are all part of one team.  This team includes your primary Cardiologist (physician) and Advanced Practice Providers or APPs (Physician Assistants and Nurse Practitioners) who all work together to provide you with the care you need, when you need it.  Your next appointment:   6 month(s)  Provider:   Dr. Kate   We recommend signing up for the patient portal called MyChart.  Sign up information is  provided on this After Visit Summary.  MyChart is used to connect with patients for Virtual Visits (Telemedicine).  Patients are able to view lab/test results, encounter notes, upcoming appointments, etc.  Non-urgent messages can be sent to your provider as well.   To learn more about what you can do with MyChart, go to [email or web address].   Other Instructions   Your cardiac CT will be scheduled at the below location:    [facility] [address]  [place], [place] [address] [phone]   If scheduled at the Heart and Vascular Tower at [facility] street, please enter the parking lot using the [address] entrance and use the FREE valet service at the patient drop-off area. Enter the building and check-in with registration on the main floor.   Please follow these instructions carefully (unless otherwise directed):  An IV will be required for this test and Nitroglycerin will be given.   On the Night Before the Test: Be sure to Drink plenty of water. Do not consume any caffeinated/decaffeinated beverages or chocolate 12 hours prior  to your test. Do not take any antihistamines 12 hours prior to your test.   On the Day of the Test: Drink plenty of water until 1 hour prior to the test. Do not eat any food 1 hour prior to test. You may take your regular medications prior to the test.  Take metoprolol (Lopressor) 100mg  two hours prior to test. If you take Furosemide/Hydrochlorothiazide/Spironolactone/Chlorthalidone, please HOLD on the morning of the test. Patients who wear a continuous glucose monitor MUST remove the device prior to scanning. FEMALES- please wear underwire-free bra if available, avoid dresses & tight clothing      After the Test: Drink plenty of water. After receiving IV contrast, you may experience a mild flushed feeling. This is normal. On occasion, you may experience a mild rash up to 24 hours after the test. This is not  dangerous. If this occurs, you can take Benadryl 25 mg, Zyrtec, Claritin, or Allegra and increase your fluid intake. (Patients taking Tikosyn should avoid Benadryl, and may take Zyrtec, Claritin, or Allegra) If you experience trouble breathing, this can be serious. If it is severe call 911 IMMEDIATELY. If it is mild, please call our office.  We will call to schedule your test 2-4 weeks out understanding that some insurance companies will need an authorization prior to the service being performed.   For more information and frequently asked questions, please visit our website : [email or web address]  For non-scheduling related questions, please contact the cardiac imaging nurse navigator should you have any questions/concerns: Cardiac Imaging Nurse Navigators Direct Office Dial: [phone]   For scheduling needs, including cancellations and rescheduling, please call [place], [phone].

## 2024-04-08 NOTE — Progress Notes (Unsigned)
 Enrolled for [employer] to mail a ZIO XT long term holter monitor to the patients address on file.

## 2024-04-08 NOTE — Progress Notes (Signed)
 Cardiology Office Note:    Date:  04/08/2024   ID:  Kelly Osborne, DOB 1975-09-10, MRN [id number]  PCP:  Stuart Norris, NP  Cardiologist:  None  Electrophysiologist:  None   Referring MD: Stuart Norris, NP   Chief Complaint  Patient presents with   Chest Pain    History of Present Illness:    Kelly Osborne is a 48 y.o. female with a hx of thyroid  cancer who presents as an ED follow-up for chest pain.  Seen in ED 01/2024 with chest pain, workup unremarkable.  Reports chest pain started that evening, describes dull aching pain that kept her up at night.  Lasted throughout the day next day.  Reports she was started on omeprazole.  She denies further chest pain but reports has not been exercising.  She does report she gets dyspnea with walking up stairs.  Denies any lightheadedness or syncope.  Does report palpitations daily, worse with caffeine intake.  She reports short duration but can occur throughout the day.  Does report some lower extremity edema.  No smoking history.  Family history includes brother had A-fib ablation and has had issues with SVT and PVCs.   Past Medical History:  Diagnosis Date   Heart murmur     Past Surgical History:  Procedure Laterality Date   DILATATION & CURETTAGE/HYSTEROSCOPY WITH MYOSURE N/A 07/21/2016   Procedure: DILATATION & CURETTAGE/HYSTEROSCOPY WITH MYOSURE;  Surgeon: Dickie Carder, MD;  Location: [facility];  Service: Gynecology;  Laterality: N/A;   HYSTEROSCOPY N/A 07/21/2016   Procedure: HYSTEROSCOPY WITH HYDROTHERMAL ABLATION;  Surgeon: Dickie Carder, MD;  Location: [facility];  Service: Gynecology;  Laterality: N/A;   LAPAROSCOPIC VAGINAL HYSTERECTOMY WITH SALPINGECTOMY Bilateral 01/12/2017   Procedure: LAPAROSCOPIC ASSISTED VAGINAL HYSTERECTOMY WITH SALPINGECTOMY;  Surgeon: Carder Dickie, MD;  Location: [facility];  Service: Gynecology;  Laterality: Bilateral;   MYRINGOTOMY     THYROID  LOBECTOMY Right 11/09/2020   Procedure: RIGHT  THYROID  LOBECTOMY;  Surgeon: Eletha Boas, MD;  Location: [facility];  Service: General;  Laterality: Right;   WISDOM TOOTH EXTRACTION      Current Medications: Current Meds  Medication Sig   metoprolol tartrate (LOPRESSOR) 100 MG tablet Take 1 tablet (100mg ) TWO hours prior to CT scan   omeprazole (PRILOSEC) 10 MG capsule Take 10 mg by mouth daily.     Allergies:   Sulfa antibiotics   Social History   Socioeconomic History   Marital status: Married    Spouse name: Not on file   Number of children: Not on file   Years of education: Not on file   Highest education level: Not on file  Occupational History   Not on file  Tobacco Use   Smoking status: Never   Smokeless tobacco: Never  Vaping Use   Vaping status: Never Used  Substance and Sexual Activity   Alcohol use: Never    Comment: rare   Drug use: Never   Sexual activity: Yes    Birth control/protection: Pill  Other Topics Concern   Not on file  Social History Narrative   Not on file   Social Drivers of Health   Financial Resource Strain: Not on file  Food Insecurity: Not on file  Transportation Needs: Not on file  Physical Activity: Not on file  Stress: Not on file  Social Connections: Not on file     Family History: The patient's family history is not on file.  ROS:   Please see the history of present illness.  All other systems reviewed and are negative.  EKGs/Labs/Other Studies Reviewed:    The following studies were reviewed today:   EKG:   04/08/2024: Normal sinus rhythm, rate 87, nonspecific T wave flattening  Recent Labs: 01/15/2024: BUN 10; Creatinine, Ser 0.88; Hemoglobin 13.1; Platelets 338; Potassium 3.8; Sodium 140 02/12/2024: TSH 2.18  Recent Lipid Panel No results found for: CHOL, TRIG, HDL, CHOLHDL, VLDL, LDLCALC, LDLDIRECT  Physical Exam:    VS:  BP 122/80 (BP Location: Right Arm, Patient Position: Sitting, Cuff Size: Normal)   Pulse 87   Ht 5' 4 (1.626 m)   Wt  189 lb (85.7 kg)   LMP 12/23/2016 (Exact Date)   SpO2 96%   BMI 32.44 kg/m     Wt Readings from Last 3 Encounters:  04/08/24 189 lb (85.7 kg)  02/12/24 186 lb 12.8 oz (84.7 kg)  01/15/24 184 lb (83.5 kg)     GEN:  Well nourished, well developed in no acute distress HEENT: Normal NECK: No JVD; No carotid bruits LYMPHATICS: No lymphadenopathy CARDIAC: RRR, 2 out of 6 systolic murmur RESPIRATORY:  Clear to auscultation without rales, wheezing or rhonchi  ABDOMEN: Soft, non-tender, non-distended MUSCULOSKELETAL:  No edema; No deformity  SKIN: Warm and dry NEUROLOGIC:  Alert and oriented x 3 PSYCHIATRIC:  Normal affect   ASSESSMENT:    1. Chest pain of uncertain etiology   2. DOE (dyspnea on exertion)   3. Precordial pain   4. Murmur, cardiac   5. Palpitations    PLAN:    Chest pain/DOE: Reports atypical chest pain but also having dyspnea on exertion that could represent anginal equivalent.  Recommend coronary CTA to rule out obstructive CAD.  Will give Lopressor 100 mg prior to study  Heart murmur: 2/6  systolic murmur on exam, will check echocardiogram  Palpitations: Description concerning for arrhythmia, will evaluate with Zio patch x 1 week  Hyperlipidemia: LDL 113 on 06/2018, will update lipid panel and follow-up results of coronary CTA to guide how aggressive to be in lowering cholesterol  RTC in 6 months   Medication Adjustments/Labs and Tests Ordered: Current medicines are reviewed at length with the patient today.  Concerns regarding medicines are outlined above.  Orders Placed This Encounter  Procedures   CT CORONARY MORPH W/CTA COR W/SCORE W/CA W/CM &/OR WO/CM   Basic metabolic panel with GFR   Lipid panel   LONG TERM MONITOR (3-14 DAYS)   EKG 12-Lead   ECHOCARDIOGRAM COMPLETE   Meds ordered this encounter  Medications   metoprolol tartrate (LOPRESSOR) 100 MG tablet    Sig: Take 1 tablet (100mg ) TWO hours prior to CT scan    Dispense:  1 tablet     Refill:  0    Patient Instructions  Medication Instructions:  Your physician recommends that you continue on your current medications as directed. Please refer to the Current Medication list given to you today.  *If you need a refill on your cardiac medications before your next appointment, please call your pharmacy*  Lab Work: Your physician recommends that you have labs drawn today: BMET & Lipids  If you have labs (blood work) drawn today and your tests are completely normal, you will receive your results only by: MyChart Message (if you have MyChart) OR A paper copy in the mail If you have any lab test that is abnormal or we need to change your treatment, we will call you to review the results.  Testing/Procedures: Your physician has requested that you  have an echocardiogram. Echocardiography is a painless test that uses sound waves to create images of your heart. It provides your doctor with information about the size and shape of your heart and how well your heart's chambers and valves are working. This procedure takes approximately one hour. There are no restrictions for this procedure. Please do NOT wear cologne, perfume, aftershave, or lotions (deodorant is allowed). Please arrive 15 minutes prior to your appointment time.  Please note: We ask at that you not bring children with you during ultrasound (echo/ vascular) testing. Due to room size and safety concerns, children are not allowed in the ultrasound rooms during exams. Our front office staff cannot provide observation of children in our lobby area while testing is being conducted. An adult accompanying a patient to their appointment will only be allowed in the ultrasound room at the discretion of the ultrasound technician under special circumstances. We apologize for any inconvenience.   ZIO XT- Long Term Monitor Instructions  Your physician has requested you wear a ZIO patch monitor for 7 days.  This is a single patch  monitor. [employer] supplies one patch monitor per enrollment. Additional stickers are not available. Please do not apply patch if you will be having a Nuclear Stress Test,  Echocardiogram, Cardiac CT, MRI, or Chest Xray during the period you would be wearing the  monitor. The patch cannot be worn during these tests. You cannot remove and re-apply the  ZIO XT patch monitor.  Your ZIO patch monitor will be mailed 3 day USPS to your address on file. It may take 3-5 days  to receive your monitor after you have been enrolled.  Once you have received your monitor, please review the enclosed instructions. Your monitor  has already been registered assigning a specific monitor serial # to you.  Billing and Patient Assistance Program Information  We have supplied [employer] with any of your insurance information on file for billing purposes. [employer] offers a sliding scale Patient Assistance Program for patients that do not have  insurance, or whose insurance does not completely cover the cost of the ZIO monitor.  You must apply for the Patient Assistance Program to qualify for this discounted rate.  To apply, please call [employer] at [phone], select option 4, select option 2, ask to apply for  Patient Assistance Program. Meredeth will ask your household income, and how many people  are in your household. They will quote your out-of-pocket cost based on that information.  [employer] will also be able to set up a 3-month, interest-free payment plan if needed.  Applying the monitor   Shave hair from upper left chest.  Hold abrader disc by orange tab. Rub abrader in 40 strokes over the upper left chest as  indicated in your monitor instructions.  Clean area with 4 enclosed alcohol pads. Let dry.  Apply patch as indicated in monitor instructions. Patch will be placed under collarbone on left  side of chest with arrow pointing upward.  Rub patch adhesive wings for 2 minutes. Remove white label marked 1.  Remove the white  label marked 2. Rub patch adhesive wings for 2 additional minutes.  While looking in a mirror, press and release button in center of patch. A small green light will  flash 3-4 times. This will be your only indicator that the monitor has been turned on.  Do not shower for the first 24 hours. You may shower after the first 24 hours.  Press the button if you feel  a symptom. You will hear a small click. Record Date, Time and  Symptom in the Patient Logbook.  When you are ready to remove the patch, follow instructions on the last 2 pages of Patient  Logbook. Stick patch monitor onto the last page of Patient Logbook.  Place Patient Logbook in the blue and white box. Use locking tab on box and tape box closed  securely. The blue and white box has prepaid postage on it. Please place it in the mailbox as  soon as possible. Your physician should have your test results approximately 7 days after the  monitor has been mailed back to [facility].  Call [facility] Customer Care at [phone] if you have questions regarding  your ZIO XT patch monitor. Call them immediately if you see an orange light blinking on your  monitor.  If your monitor falls off in less than 4 days, contact our Monitor department at [phone].  If your monitor becomes loose or falls off after 4 days call [employer] at [phone] for  suggestions on securing your monitor   Follow-Up: At [facility], you and your health needs are our priority.  As part of our continuing mission to provide you with exceptional heart care, our providers are all part of one team.  This team includes your primary Cardiologist (physician) and Advanced Practice Providers or APPs (Physician Assistants and Nurse Practitioners) who all work together to provide you with the care you need, when you need it.  Your next appointment:   6 month(s)  Provider:   Dr. Kate   We recommend signing up for the patient  portal called MyChart.  Sign up information is provided on this After Visit Summary.  MyChart is used to connect with patients for Virtual Visits (Telemedicine).  Patients are able to view lab/test results, encounter notes, upcoming appointments, etc.  Non-urgent messages can be sent to your provider as well.   To learn more about what you can do with MyChart, go to [email or web address].   Other Instructions   Your cardiac CT will be scheduled at the below location:    [facility] [address]  [place], [place] [address] [phone]   If scheduled at the Heart and Vascular Tower at [facility] street, please enter the parking lot using the [address] entrance and use the FREE valet service at the patient drop-off area. Enter the building and check-in with registration on the main floor.   Please follow these instructions carefully (unless otherwise directed):  An IV will be required for this test and Nitroglycerin will be given.   On the Night Before the Test: Be sure to Drink plenty of water. Do not consume any caffeinated/decaffeinated beverages or chocolate 12 hours prior to your test. Do not take any antihistamines 12 hours prior to your test.   On the Day of the Test: Drink plenty of water until 1 hour prior to the test. Do not eat any food 1 hour prior to test. You may take your regular medications prior to the test.  Take metoprolol (Lopressor) 100mg  two hours prior to test. If you take Furosemide/Hydrochlorothiazide/Spironolactone/Chlorthalidone, please HOLD on the morning of the test. Patients who wear a continuous glucose monitor MUST remove the device prior to scanning. FEMALES- please wear underwire-free bra if available, avoid dresses & tight clothing      After the Test: Drink plenty of water. After receiving IV contrast, you may experience a mild flushed feeling. This is  normal. On occasion, you may experience a mild rash  up to 24 hours after the test. This is not dangerous. If this occurs, you can take Benadryl 25 mg, Zyrtec, Claritin, or Allegra and increase your fluid intake. (Patients taking Tikosyn should avoid Benadryl, and may take Zyrtec, Claritin, or Allegra) If you experience trouble breathing, this can be serious. If it is severe call 911 IMMEDIATELY. If it is mild, please call our office.  We will call to schedule your test 2-4 weeks out understanding that some insurance companies will need an authorization prior to the service being performed.   For more information and frequently asked questions, please visit our website : [email or web address]  For non-scheduling related questions, please contact the cardiac imaging nurse navigator should you have any questions/concerns: Cardiac Imaging Nurse Navigators Direct Office Dial: [phone]   For scheduling needs, including cancellations and rescheduling, please call [place], [phone].            Signed, Lonni LITTIE Nanas, MD  04/08/2024 2:18 PM    [facility]

## 2024-04-09 ENCOUNTER — Ambulatory Visit: Payer: Self-pay | Admitting: Cardiology

## 2024-04-09 LAB — LIPID PANEL
Chol/HDL Ratio: 3.1 ratio (ref 0.0–4.4)
Cholesterol, Total: 199 mg/dL (ref 100–199)
HDL: 64 mg/dL (ref >39)
LDL Chol Calc (NIH): 113 mg/dL — ABNORMAL HIGH (ref 0–99)
Triglycerides: 124 mg/dL (ref 0–149)
VLDL Cholesterol Cal: 22 mg/dL (ref 5–40)

## 2024-04-09 LAB — BASIC METABOLIC PANEL WITH GFR
BUN/Creatinine Ratio: 13 (ref 9–23)
BUN: 11 mg/dL (ref 6–24)
CO2: 24 mmol/L (ref 20–29)
Calcium: 9.2 mg/dL (ref 8.7–10.2)
Chloride: 99 mmol/L (ref 96–106)
Creatinine, Ser: 0.87 mg/dL (ref 0.57–1.00)
Glucose: 80 mg/dL (ref 70–99)
Potassium: 4 mmol/L (ref 3.5–5.2)
Sodium: 137 mmol/L (ref 134–144)
eGFR: 83 mL/min/1.73 (ref >59)

## 2024-04-22 ENCOUNTER — Encounter (HOSPITAL_COMMUNITY): Payer: Self-pay

## 2024-04-23 ENCOUNTER — Other Ambulatory Visit (HOSPITAL_COMMUNITY)

## 2024-04-24 ENCOUNTER — Ambulatory Visit (HOSPITAL_COMMUNITY)
Admission: RE | Admit: 2024-04-24 | Discharge: 2024-04-24 | Disposition: A | Discharge status: Home / Self Care | Source: Ambulatory Visit | Attending: Cardiology | Admitting: Cardiology

## 2024-04-24 DIAGNOSIS — R079 Chest pain, unspecified: Secondary | ICD-10-CM | POA: Insufficient documentation

## 2024-04-24 DIAGNOSIS — R002 Palpitations: Secondary | ICD-10-CM | POA: Diagnosis not present

## 2024-04-24 DIAGNOSIS — R011 Cardiac murmur, unspecified: Secondary | ICD-10-CM | POA: Insufficient documentation

## 2024-04-24 DIAGNOSIS — R072 Precordial pain: Secondary | ICD-10-CM | POA: Diagnosis not present

## 2024-04-24 MED ORDER — IOHEXOL 350 MG/ML SOLN
100.0000 mL | Freq: Once | INTRAVENOUS | Status: AC | PRN
  Administered 2024-04-24: 100 mL via INTRAVENOUS

## 2024-04-24 MED ORDER — NITROGLYCERIN 0.4 MG SL SUBL
0.8000 mg | SUBLINGUAL_TABLET | Freq: Once | SUBLINGUAL | Status: AC
  Administered 2024-04-24: 0.8 mg via SUBLINGUAL

## 2024-04-24 NOTE — Progress Notes (Signed)
 Patient presents for cardiac CT.  IV in the Left AC extravasated during contrast injection.  Edema noted to site, patient denies numbness to extremity, +2 radial pulse, no redness noted.   IV removed, ice pack and pressure dressing applied.   Patient aware to elevate extremity. Precautions discussed and patient verbalized understanding.  Dr. Mona made aware.    2nd IV started and study completed.

## 2024-04-25 ENCOUNTER — Telehealth (HOSPITAL_COMMUNITY): Payer: Self-pay | Admitting: *Deleted

## 2024-04-25 NOTE — Telephone Encounter (Signed)
 Called patient to follow up on IV extravasation during cardiac CT. Patient reports swelling still present but decreased.  Denies numbness, redness, or tingling.   Patient aware to call with any questions/concerns, verbalized understanding.

## 2024-05-17 ENCOUNTER — Ambulatory Visit (HOSPITAL_COMMUNITY)
Admission: RE | Admit: 2024-05-17 | Discharge: 2024-05-17 | Disposition: A | Discharge status: Home / Self Care | Source: Ambulatory Visit | Attending: Cardiology | Admitting: Cardiology

## 2024-05-17 DIAGNOSIS — R079 Chest pain, unspecified: Secondary | ICD-10-CM | POA: Diagnosis not present

## 2024-05-17 DIAGNOSIS — R011 Cardiac murmur, unspecified: Secondary | ICD-10-CM | POA: Insufficient documentation

## 2024-05-17 DIAGNOSIS — R072 Precordial pain: Secondary | ICD-10-CM | POA: Diagnosis not present

## 2024-05-17 DIAGNOSIS — R002 Palpitations: Secondary | ICD-10-CM | POA: Diagnosis not present

## 2024-05-17 LAB — ECHOCARDIOGRAM COMPLETE
Area-P 1/2: 2.95 cm2
S' Lateral: 2.7 cm

## 2024-05-26 DIAGNOSIS — R002 Palpitations: Secondary | ICD-10-CM

## 2024-05-27 NOTE — Telephone Encounter (Signed)
 See result note.

## 2024-06-17 DIAGNOSIS — N390 Urinary tract infection, site not specified: Secondary | ICD-10-CM | POA: Diagnosis not present

## 2024-06-24 DIAGNOSIS — H9203 Otalgia, bilateral: Secondary | ICD-10-CM | POA: Diagnosis not present

## 2024-09-05 ENCOUNTER — Ambulatory Visit: Admitting: Internal Medicine
